# Patient Record
Sex: Male | Born: 1935 | Race: Black or African American | Hispanic: No | Marital: Single | Smoking: Never smoker
Health system: Southern US, Community
[De-identification: ages and names within clinical notes are randomized; demographics above are authoritative.]

## PROBLEM LIST (undated history)

## (undated) DIAGNOSIS — Z950 Presence of cardiac pacemaker: Secondary | ICD-10-CM

## (undated) DIAGNOSIS — N183 Chronic kidney disease, stage 3 unspecified: Secondary | ICD-10-CM

## (undated) DIAGNOSIS — G309 Alzheimer's disease, unspecified: Secondary | ICD-10-CM

## (undated) DIAGNOSIS — R519 Headache, unspecified: Secondary | ICD-10-CM

## (undated) DIAGNOSIS — D696 Thrombocytopenia, unspecified: Secondary | ICD-10-CM

## (undated) DIAGNOSIS — R51 Headache: Secondary | ICD-10-CM

## (undated) DIAGNOSIS — I10 Essential (primary) hypertension: Secondary | ICD-10-CM

## (undated) DIAGNOSIS — F0280 Dementia in other diseases classified elsewhere without behavioral disturbance: Secondary | ICD-10-CM

## (undated) DIAGNOSIS — I495 Sick sinus syndrome: Secondary | ICD-10-CM

## (undated) HISTORY — DX: Sick sinus syndrome: I49.5

---

## 2005-07-23 ENCOUNTER — Encounter: Admission: RE | Admit: 2005-07-23 | Discharge: 2005-07-23 | Payer: Self-pay | Admitting: Family Medicine

## 2005-11-09 HISTORY — PX: PACEMAKER INSERTION: SHX728

## 2005-11-09 HISTORY — PX: CARDIAC CATHETERIZATION: SHX172

## 2006-03-24 ENCOUNTER — Inpatient Hospital Stay (HOSPITAL_COMMUNITY): Admission: EM | Admit: 2006-03-24 | Discharge: 2006-03-26 | Payer: Self-pay | Admitting: Emergency Medicine

## 2009-09-29 ENCOUNTER — Encounter: Payer: Self-pay | Admitting: Emergency Medicine

## 2009-09-30 ENCOUNTER — Encounter (INDEPENDENT_AMBULATORY_CARE_PROVIDER_SITE_OTHER): Payer: Self-pay | Admitting: Internal Medicine

## 2009-09-30 ENCOUNTER — Inpatient Hospital Stay (HOSPITAL_COMMUNITY): Admission: EM | Admit: 2009-09-30 | Discharge: 2009-10-01 | Payer: Self-pay | Admitting: Internal Medicine

## 2010-07-12 ENCOUNTER — Emergency Department (HOSPITAL_COMMUNITY): Admission: EM | Admit: 2010-07-12 | Discharge: 2010-07-12 | Payer: Self-pay | Admitting: Emergency Medicine

## 2011-01-22 LAB — CBC
HCT: 38.5 % — ABNORMAL LOW (ref 39.0–52.0)
Hemoglobin: 13.4 g/dL (ref 13.0–17.0)
MCH: 29.7 pg (ref 26.0–34.0)
MCHC: 34.8 g/dL (ref 30.0–36.0)
RDW: 12.9 % (ref 11.5–15.5)
WBC: 8.7 10*3/uL (ref 4.0–10.5)

## 2011-01-22 LAB — BRAIN NATRIURETIC PEPTIDE: Pro B Natriuretic peptide (BNP): <30 pg/mL (ref 0.0–100.0)

## 2011-01-22 LAB — DIFFERENTIAL
Basophils Absolute: 0 10*3/uL (ref 0.0–0.1)
Lymphocytes Relative: 12 % (ref 12–46)
Lymphs Abs: 1.1 10*3/uL (ref 0.7–4.0)
Monocytes Relative: 6 % (ref 3–12)

## 2011-01-22 LAB — BASIC METABOLIC PANEL
CO2: 24 mEq/L (ref 19–32)
Creatinine, Ser: 1.61 mg/dL — ABNORMAL HIGH (ref 0.4–1.5)
GFR calc non Af Amer: 42 mL/min — ABNORMAL LOW (ref >60)

## 2011-01-22 LAB — POCT CARDIAC MARKERS: Troponin i, poc: <0.05 ng/mL (ref 0.00–0.09)

## 2011-02-11 LAB — CARDIAC PANEL(CRET KIN+CKTOT+MB+TROPI)
CK, MB: 1.3 ng/mL (ref 0.3–4.0)
CK, MB: 1.5 ng/mL (ref 0.3–4.0)
Relative Index: 1 (ref 0.0–2.5)
Total CK: 124 U/L (ref 7–232)
Total CK: 131 U/L (ref 7–232)
Troponin I: 0.01 ng/mL (ref 0.00–0.06)
Troponin I: 0.01 ng/mL (ref 0.00–0.06)

## 2011-02-11 LAB — CROSSMATCH
ABO/RH(D): B POS
Antibody Screen: NEGATIVE

## 2011-02-11 LAB — COMPREHENSIVE METABOLIC PANEL
AST: 22 U/L (ref 0–37)
Albumin: 3 g/dL — ABNORMAL LOW (ref 3.5–5.2)
BUN: 48 mg/dL — ABNORMAL HIGH (ref 6–23)
CO2: 27 mEq/L (ref 19–32)
Calcium: 9 mg/dL (ref 8.4–10.5)
Chloride: 107 mEq/L (ref 96–112)
Creatinine, Ser: 1.39 mg/dL (ref 0.4–1.5)
GFR calc Af Amer: >60 mL/min (ref >60)
Glucose, Bld: 191 mg/dL — ABNORMAL HIGH (ref 70–99)
Potassium: 4.5 mEq/L (ref 3.5–5.1)

## 2011-02-11 LAB — PROTIME-INR
INR: 1.2 (ref 0.00–1.49)
Prothrombin Time: 15.1 seconds (ref 11.6–15.2)

## 2011-02-11 LAB — CBC
HCT: 27.6 % — ABNORMAL LOW (ref 39.0–52.0)
HCT: 32.3 % — ABNORMAL LOW (ref 39.0–52.0)
Hemoglobin: 9.4 g/dL — ABNORMAL LOW (ref 13.0–17.0)
MCHC: 34.1 g/dL (ref 30.0–36.0)
MCHC: 33.2 g/dL (ref 30.0–36.0)
MCV: 90.9 fL (ref 78.0–100.0)
MCV: 91.3 fL (ref 78.0–100.0)
MCV: 91.2 fL (ref 78.0–100.0)
Platelets: 119 10*3/uL — ABNORMAL LOW (ref 150–400)
Platelets: 128 10*3/uL — ABNORMAL LOW (ref 150–400)
RBC: 2.72 MIL/uL — ABNORMAL LOW (ref 4.22–5.81)
RBC: 3.02 MIL/uL — ABNORMAL LOW (ref 4.22–5.81)
RDW: 13.1 % (ref 11.5–15.5)
RDW: 13.2 % (ref 11.5–15.5)
RDW: 13.4 % (ref 11.5–15.5)
WBC: 8.3 10*3/uL (ref 4.0–10.5)
WBC: 8.1 10*3/uL (ref 4.0–10.5)

## 2011-02-11 LAB — DIFFERENTIAL
Basophils Absolute: 0 10*3/uL (ref 0.0–0.1)
Basophils Relative: 1 % (ref 0–1)
Eosinophils Absolute: 0.2 10*3/uL (ref 0.0–0.7)
Neutro Abs: 5.5 10*3/uL (ref 1.7–7.7)
Neutrophils Relative %: 67 % (ref 43–77)

## 2011-02-11 LAB — BASIC METABOLIC PANEL
BUN: 20 mg/dL (ref 6–23)
Chloride: 109 mEq/L (ref 96–112)
Chloride: 115 mEq/L — ABNORMAL HIGH (ref 96–112)
Creatinine, Ser: 1.09 mg/dL (ref 0.4–1.5)
Creatinine, Ser: 1.18 mg/dL (ref 0.4–1.5)
Glucose, Bld: 104 mg/dL — ABNORMAL HIGH (ref 70–99)
Potassium: 3.8 mEq/L (ref 3.5–5.1)

## 2011-02-11 LAB — HEMOGLOBIN AND HEMATOCRIT, BLOOD
HCT: 26.5 % — ABNORMAL LOW (ref 39.0–52.0)
HCT: 27.4 % — ABNORMAL LOW (ref 39.0–52.0)
Hemoglobin: 9.4 g/dL — ABNORMAL LOW (ref 13.0–17.0)

## 2011-02-11 LAB — APTT: aPTT: 22 seconds — ABNORMAL LOW (ref 24–37)

## 2011-02-11 LAB — CK TOTAL AND CKMB (NOT AT ARMC): Total CK: 188 U/L (ref 7–232)

## 2011-02-11 LAB — TROPONIN I: Troponin I: 0.02 ng/mL (ref 0.00–0.06)

## 2011-02-11 LAB — ABO/RH: ABO/RH(D): B POS

## 2011-03-27 NOTE — Cardiovascular Report (Signed)
Wesley Cook, Wesley Cook NO.:  [id number]   MEDICAL RECORD NO.:  [id number]          PATIENT TYPE:  INP   LOCATION:  2036                         FACILITY:  [facility]   PHYSICIAN:  Corky Crafts, MDDATE OF BIRTH:  12-05-1933   DATE OF PROCEDURE:  03/25/2006  DATE OF DISCHARGE:                              CARDIAC CATHETERIZATION   PRIMARY CARE PHYSICIAN:  Joselyn Arrow, M.D.   PROCEDURES:  Left heart catheterization, coronary angiogram, left  ventriculogram, abdominal aortogram.   SURGEON:  Dr. Eldridge Dace.   INDICATIONS FOR PROCEDURE:  Syncope, bradycardia, and shortness of breath.   DESCRIPTION OF PROCEDURE:  After the risks and benefits of cardiac  catheterization were explained to the patient and informed consent was  obtained, the patient was brought to the catheterization lab and placed on  the table. He was prepped and draped in the usual sterile fashion. Lidocaine  1% was infiltrated into his right groin. A 6 French arterial sheath was  placed into his right femoral artery using the modified Seldinger technique.  Left coronary artery angiography was performed using a JL 4.0 catheter. The  catheter was advanced to the ostium of the left main under fluoroscopic  guidance. Digital angiography was performed in multiple projections using  hand injection of contrast. Right coronary artery angiography was then  performed with a JR 4.0 catheter. Digital angiography was performed in  multipleprojections using hand injection of contrast. A pigtail catheter was  then advanced to the ascending aorta and across the aortic valve under  fluoroscopic guidance. A power injection of contrast was performed in the 30  degree RAO position to obtain a ventriculogram. Pressure monitoring was  performed prior to the ventriculogram. The catheter was then pulled back  across the aortic valve under continuous hemodynamic pressure monitoring.  The catheter was pulled back to the  abdominal aorta. A power injection of  contrast was performed to visualize the abdominal aorta. The sheath will be  pulled out using manual compression.   FINDINGS:  1.  The left main is widely patent.  2.  The left circumflex is a medium sized vessel with luminal      irregularities.  3.  The ramus is a large vessel, which supplies the lateral wall of the      heart. There are luminal irregularities in this vessel.  4.  The left anterior descending is a large vessel with luminal      irregularities throughout including an ostial 20% lesion.  5.  The first diagonal and second diagonal are both small vessels.  6.  The right coronary artery is a medium sized dominant vessel with luminal      irregularities.  7.  The left ventriculogram shows normal left ventricular function with an      ejection fraction of 60%. There is no mitral regurgitation.  8.  The abdominal aortogram shows no aneurysm. There is a single left renal      artery, which is free of stenosis. There is possible dual supply to the      right kidney. There is no stenosis noted  in either vessel.   HEMODYNAMICS:  Left ventricular pressure 140/14; LVEDP of 26 mmHg; aortic  pressure 140/63; mean aortic pressure of 92 mmHg.   IMPRESSION:  1.  No hemodynamically significant coronary artery disease.  2.  Normal left ventricular function.  3.  No renal artery stenosis.   RECOMMENDATIONS:  1.  The patient will require aggressive preventive therapy.  2.  Will also consider placing a permanent pacemaker. It was noted pre-      procedure that the patient had significant bradycardia with heart rate      noted to be at 38 beats per minute.  3.  Continue aspirin.  4.  Continue blood pressure control and lipid management.  5.  The patient will follow up with both Dr. Lynelle Doctor and myself.      Corky Crafts, MD  Electronically Signed     JSV/MEDQ  D:  03/25/2006  T:  03/26/2006  Job:  [id number]   cc:   Lavonda Jumbo, M.D.   Fax: [phone]

## 2011-03-27 NOTE — H&P (Signed)
NAMEAMEYA, KUTZ NO.:  [id number]   MEDICAL RECORD NO.:  [id number]          PATIENT TYPE:  EMS   LOCATION:  MAJO                         FACILITY:  [facility]   PHYSICIAN:  Hollice Espy, M.D.DATE OF BIRTH:  1934-08-03   DATE OF ADMISSION:  03/23/2006  DATE OF DISCHARGE:                                HISTORY & PHYSICAL   ATTENDING PHYSICIAN:  Sherin Quarry, M.D.   PRIMARY CARE PHYSICIAN:  Lavonda Jumbo, M.D.   [facility]   CHIEF COMPLAINT:  Syncope.   HISTORY OF PRESENT ILLNESS:  The patient is a 77 year old African-American  male with past medical history of glaucoma and MI in 1995 as well as in 1998  who recently moved down to [place].  He said he has been in good health  with no complaints.  He is followed by his PCP here in town.  He previously  has been doing well, but then today felt very weak and all of a sudden he  passed out.  He did not lose control of his bowel or bladder and was only  unconscious for less than a minute.  His wife was with him.  He was brought  into the emergency room for further evaluation.  In the emergency room, the  patient had labs checked.  He was noted to have a white count of 10.3 with  an 80% shift.  The other labs of note were a creatinine of 1.6 with a normal  BUN, but most concerning was a heart rate which has stayed low ranging  anywhere from 56 down to as low as 46 and no higher.  The patient's blood  pressure was initially low at 90/47, since then has slightly improved to  116/71.  Currently, the patient is doing well and denies any headaches,  vision changes, dysphagia, chest pain, palpitations, shortness of breath,  wheeze, cough, abdominal pain, hematuria, dysuria, constipation, diarrhea,  focal extremity numbness, weakness, or pain.   REVIEW OF SYSTEMS:  Otherwise negative.   PAST MEDICAL HISTORY:  MI x2 and glaucoma.   MEDICATIONS:  1.  Trusopt ophthalmic solution one drop  both eyes three times a day.  2.  Xalatan 0.005% ophthalmic solution one drop both eyes once a day.  3.  Aspirin 81 mg p.o. daily.  4.  Multivitamin p.o. daily.  5.  Darvon one p.o. daily.   SOCIAL HISTORY:  The patient denies any tobacco, alcohol, or drug use.   FAMILY HISTORY:  Noncontributory.   PHYSICAL EXAMINATION:  VITAL SIGNS:  Temperature 97.2, heart rate 52 down to  as low as 46, blood pressure initially 90/47 and since then has improved to  116/71, respirations 18, O2 saturation 100% on room air.  GENERAL:  The patient is alert and oriented x3 in no acute distress.  HEENT:  Normocephalic and atraumatic.  His mucous membranes are moist.  He  has no carotid bruits.  HEART:  Regular rhythm, mild bradycardia.  LUNGS:  Clear to auscultation bilaterally.  ABDOMEN:  Soft, nontender, nondistended, positive bowel sounds.  EXTREMITIES:  No cyanosis, clubbing,  or edema.   LABORATORY DATA:  Sodium 131, potassium 3.7, chloride 111, bicarb 26, BUN  14, creatinine 1.6, glucose 110.  LFT's are notable for an albumin of 3 and  total protein of 4.9, magnesium level 2, sodium 141, potassium 3.7, chloride  108, bicarb 23, BUN 15, creatinine 1.6, glucose 102.   ASSESSMENT:  1.  Syncope likely secondary to bradycardia.  No clear evidence of any      medication-related causes.  Concerned with the patient's coronary artery      disease history.  I would favor checking cardiac enzymes now and repeat      again in the morning.  We will also get [facility] involved for      evaluation of possible sick sinus syndrome.  2.  Renal insufficiency, likely brought on by hypertension and coronary      artery disease.  We will continue to follow and gently hydrate, repeat      labs in the morning.  3.  Noted shift on white count.  We will recheck in the a.m.  4.  Glaucoma.  Continue medications.      Hollice Espy, M.D.  Electronically Signed     SKK/MEDQ  D:  03/23/2006  T:   03/24/2006  Job:  [id number]

## 2011-03-27 NOTE — Consult Note (Signed)
NAMEKIRILL, CHATTERJEE NO.:  [id number]   MEDICAL RECORD NO.:  [id number]          PATIENT TYPE:  INP   LOCATION:  2036                         FACILITY:  [facility]   PHYSICIAN:  Corky Crafts, MDDATE OF BIRTH:  [birth year]   DATE OF CONSULTATION:  03/24/2006  DATE OF DISCHARGE:                                   CONSULTATION   REASON FOR CONSULTATION:  Syncope.   PRIMARY CARE PHYSICIAN:  Dr. Joselyn Arrow.   HISTORY OF PRESENT ILLNESS:  The patient is a 77 year old man with a history  of MI in 1995. He underwent cardiac catheterization at that time but  apparently did not have any intervention. That happened in [place]. In  February 2006, he was sent to [facility] for a stress Cardiolite that  revealed no ischemia or prior infarct. The patient was admitted yesterday  for syncope. He had gone fishing in the morning. Went home and ate lunch. He  then took a nap. When he woke up, he felt nauseated, dizzy, and diaphoretic.  He did not have chest pain. His wife helped him to bed and he subsequently  passed out. The patient does not remember anything until EMS arrived. He has  been noted to be bradycardiac with heart rates ranging from the high 30s to  the 40s. He has no prior history of syncope, pre-syncope, or dizziness. He  does state that over the past few weeks, he has had a few episodes of  dyspnea on exertion. When walking, he would have to stop to catch his breath  and would re-start walking.   ALLERGIES:  NO KNOWN DRUG ALLERGIES.   CURRENT MEDICATIONS:  1.  Trusopt eye drops.  2.  Xalatan eye drops.  3.  Baby aspirin daily.  4.  Multivitamin.  5.  Darvon p.r.n.   SOCIAL HISTORY:  The patient is married. He does not smoke. He does not  drink. He does not use any illegal drugs.   FAMILY HISTORY:  Mother died at age 40 from a stroke. His father died at age  51 from lung cancer.   PAST MEDICAL HISTORY:  1.  MI in 1995 as noted above.  2.   Glaucoma.  3.  Hypertension.   REVIEW OF SYSTEMS:  No chest pain. Shortness of breath as noted above. No  fever or chills. No weight loss. Nausea as noted above. No focal weakness.  No rash. All other systems negative.   PHYSICAL EXAMINATION:  VITAL SIGNS:  Blood pressure 112/66, pulse 47. The  patient is afebrile.  GENERAL:  Awake and alert. No apparent distress.  HEENT:  Head:  Normocephalic and atraumatic. Eyes:  Extraocular movements  intact.  NECK:  No carotid bruits. No JVD.  CARDIOVASCULAR:  Bradycardic. S1 and S2.  LUNGS:  Clear to auscultation bilaterally.  ABDOMEN:  Soft, nontender, and nondistended.  EXTREMITIES:  No edema. Palpable pedal pulses.  SKIN:  No rash.  BACK:  No kyphosis or scoliosis.  NEUROLOGIC:  No focal deficits.  PSYCHIATRIC:  Normal mood and affect.   LABORATORY DATA:  Hematocrit of 40.7,  platelets 138,000, creatinine 1.4.  Cardiac enzymes are negative. However, the total CKs are elevated at 394,  439.   EKG showed sinus bradycardia with a rate of 42. Nonspecific ST-T wave  changes.   MEDICAL DECISION MAKING:  A 77 year old with syncope, bradycardia, and  hypertension. No prior history of myocardial infarction.   PLAN:  1.  CARDIAC:  Given the patient's dyspnea on exertion and syncope along with      his prior history of MI, will perform a cardiac cath to evaluate for      coronary artery disease.  2.  Will also check TSH. If the patient does turn out to be hypothyroid,      that would be a treatable cause of his bradycardia. Otherwise, since he      does have sinus node dysfunction and syncope, we would consider putting      in a pacemaker.  3.  The patient will be followed on telemetry.      Corky Crafts, MD  Electronically Signed     JSV/MEDQ  D:  03/25/2006  T:  03/26/2006  Job:  [id number]

## 2011-03-27 NOTE — Discharge Summary (Signed)
[name], [name] NO.:  [id number]   MEDICAL RECORD NO.:  [id number]          PATIENT TYPE:  INP   LOCATION:  2036                         FACILITY:  [facility]   PHYSICIAN:  Sherin Quarry, MD      DATE OF BIRTH:  1933/12/17   DATE OF ADMISSION:  03/23/2006  DATE OF DISCHARGE:  03/26/2006                                 DISCHARGE SUMMARY   Wesley Cook is a 77 year old man with a past history of glaucoma as well  as a previous myocardial infarction in 1995.  On May 15 the patient suddenly  began to feel weak and experienced a syncopal episode without apparent  seizure activity or loss of bowel or bladder control.  On arrival to the  emergency room the patient was noted to have a heart rate which fluctuated  between 46 and 56.  Blood pressure was initially 90/47 but to improved with  IV hydration to 116/71.  By the time the patient was seen by Dr. Rito Ehrlich,  he was relatively asymptomatic.   PHYSICAL EXAMINATION:  VITAL SIGNS:  His temperature is 97.2, heart rate was  46, respirations were 18 and O2 saturation 100%.  HEENT exam was within normal limits.  CHEST:  Clear.  CARDIOVASCULAR:  Normal S1 andS2 without rubs, murmurs or gallops.  The  patient was noted to be bradycardiac.  ABDOMEN:  Benign.  EXTREMITIES:  No signs of cyanosis or edema.   Relevant laboratory studies obtained included serial cardiac enzymes which  were negative.  The sodium was 139, potassium 3.5, creatinine was 1.4, BUN  was 12, glucose was 100.  Thyroid studies were obtained which showed a TR of  1, TSH of 1.7. which were within normal range.   The patient was seen to consultation by Dr. Eldridge Dace of [facility],  who recommended proceeding with cardiac catheterization and then possibly  subsequent pacemaker.  Cardiac catheterization was carried out on May 17.  The patient was noted to have normal coronary arteries.  Left ventricular  ejection fraction was estimated to be 60%.  It  was Dr. Hoyle Barr  recommendation that the patient had been normal left ventricular function  and no significant coronary artery disease.  Therefore, the pacemaker was  placed.  A Medtronic dual-chamber pacemaker was implanted on May 17.  This  was performed by Dr. Amil Amen.  The patient tolerated the procedure well.  On  May 18 he was feeling well.  His telemetry showed that the pacemaker was  functioning and heart rate was consistently 60, blood pressure was 138/68.  Therefore, it was felt reasonable to discharge the patient at that time.   DISCHARGE DIAGNOSES:  1.  Symptomatic bradycardia.  2.  Syncope, probably secondary to bradycardia.  3.  Glaucoma.   DISCHARGE MEDICATIONS:  The patient will continue his glaucoma medicines,  which are Trusopt and Xalatan drops.  He will also continue aspirin 81 mg  daily.  He is of the practice of taking one Darvon daily.   He will follow up with the office with Dr. Amil Amen on May 31 and was  instructed  to call Dr. Amil Amen if any problems should arise with the  pacemaker site.  He will also follow up with Dr. Joselyn Arrow.   CONDITION AT TIME OF DISCHARGE:  Good.           ______________________________  Sherin Quarry, MD     SY/MEDQ  D:  03/26/2006  T:  03/26/2006  Job:  [id number]   cc:   Lavonda Jumbo, M.D.  Fax: [phone]   Corky Crafts, MD  Fax: [phone]

## 2011-03-27 NOTE — Op Note (Signed)
NAMEGERHART, RUGGIERI               ACCOUNT NO.:  [id number]   MEDICAL RECORD NO.:  [id number]          PATIENT TYPE:  INP   LOCATION:  2036                         FACILITY:  [facility]   PHYSICIAN:  Francisca December, M.D.  DATE OF BIRTH:  1934-01-13   DATE OF PROCEDURE:  03/25/2006  DATE OF DISCHARGE:                                 OPERATIVE REPORT   PROCEDURE PERFORMED:  1.  Insertion dual-chamber permanent transvenous pacemaker.  2.  Left subclavian venogram.   INDICATION:  Wesley Cook is a 77 year old man who was admitted with  syncope and has been observed have marked bradycardia during this telemetry  monitoring with heart rates as low as 38-45 beats per minute.  Dr. Catalina Gravel has completed coronary angiography ruling out possibility of CAD as  an etiology.  He is brought to the catheterization laboratory at this time  for insertion of dual-chamber permanent transvenous pacemaker with a  diagnosis of syncope and sinus node dysfunction.   PROCEDURE NOTE:  The patient brought to cardiac catheterization laboratory  in fasting state.  The left prepectoral region was prepped and draped in the  usual sterile fashion.  Local anesthesia was obtained with infiltration of  1% lidocaine with epinephrine throughout the left prepectoral region.  A  left subclavian venogram was then performed with a peripheral injection of  20 mL of Omnipaque.  A digital cine angiogram was obtained in the AP  projection and road mapped to guide future left subclavian puncture.  The  venogram did demonstrate the vein to be widely patent and coursing in a  normal fashion over the anterior surface of the first rib and beneath the  middle third of the clavicle.  There was no evidence for persistence of the  left superior vena cava.  A 6 to 7 cm incision was then made in the  deltopectoral groove and this was carried down by sharp dissection and  electrocautery to the prepectoral fascia.  There a plane was  lifted and the  pocket formed inferiorly and medially using electrocautery and blunt  dissection.  The pocket was then packed with 1% kanamycin soaked gauze.  Two  separate left subclavian punctures were then performed with an 18-gauge thin-  wall needle through which was passed a 0.038 inch tight J guidewire.  Over  the initial guidewire a 7-French tearaway sheath and dilator were advanced.  The dilator and wire were removed and the ventricular lead was advanced to  the level of the right atrium.  The sheath was torn away.  Using standard  technique and fluoroscopic landmarks.  The lead was manipulated in the right  ventricular apex.  There excellent pacing parameters were obtained as will  be noted below.  The lead was tested for diaphragmatic pacing at 10 volts  and none was found.  The lead was then sutured into place using three  separate 0 silk ligature.  Over the remaining guidewire a 9-French tearaway  sheath and dilator were advanced.  The dilator was removed and the wire was  allowed to remain in place and the  atrial lead was advanced to the level of  the right atrium and sheath was torn away.  A figure-of-eight hemostasis  suture was placed around the entry site of the leads into the pectoralis  muscle to control backbleeding.  Using standard technique and fluoroscopic  landmarks, the lead was manipulated into the right atrial appendage.  There  excellent pacing parameters were obtained as will be noted below.  This was  an active fixation lead and the screw was advanced as appropriate  fluoroscopy.  The lead was tested for diaphragmatic pacing at 10 volts and  none was found.  The lead was then sutured into place using three separate 0  silk ligatures.  The remaining guidewire and the kanamycin soaked gauze were  removed from the pocket.  The pocket was copiously irrigated using 1%  kanamycin solution.  The leads were then attached the pacing generator  carefully identifying  each lead by its serial number and placing each into  the appropriate receptacle.  Each lead was tightened into place and tested  for security.  The leads were then wound beneath the pacing generator and  the generator was placed in the pocket.  The pocket was inspected for  bleeding and none was found.  The pocket was then closed using 2-0 Vicryl in  a running fashion, two layers for the subcutaneous tissue.  The skin was  approximated using 4-0 Vicryl running subcuticular fashion.  Steri-Strips  and sterile dressing were applied and the patient is transported to recovery  area in stable condition in an A pace V pace mode.   EQUIPMENT DATA:  Pacing generator is Medtronic Adapta model number [id number],  serial number [id number]. Atrial lead is Medtronic model number [id number],  serial number [id number].  The ventricular lead is Medtronic model number  [id number], serial number [id number].   PACING DATA:  The ventricular lead detected a 16.5 mV R wave.  The pacing  threshold was 0.4 volts at 0.5 milliseconds pulse width.  The impedance was  746 ohms resulting in a current at capture threshold of 0.6 MA.  The atrial  lead detected a 5.9 mV P-wave.  The pacing threshold was 1.2 volts at 0.5  milliseconds pulse width.  The impedance was 499 ohms resulting in a current  at capture threshold of 2.7 MA.      Francisca December, M.D.  Electronically Signed     JHE/MEDQ  D:  03/25/2006  T:  03/26/2006  Job:  [id number]

## 2011-05-01 ENCOUNTER — Emergency Department (HOSPITAL_COMMUNITY)
Admission: EM | Admit: 2011-05-01 | Discharge: 2011-05-01 | Disposition: A | Discharge status: Home / Self Care | Payer: Medicare Other | Attending: Emergency Medicine | Admitting: Emergency Medicine

## 2011-05-01 ENCOUNTER — Emergency Department (HOSPITAL_COMMUNITY): Payer: Medicare Other

## 2011-05-01 DIAGNOSIS — M25559 Pain in unspecified hip: Secondary | ICD-10-CM | POA: Insufficient documentation

## 2011-05-01 DIAGNOSIS — E789 Disorder of lipoprotein metabolism, unspecified: Secondary | ICD-10-CM | POA: Insufficient documentation

## 2011-05-01 DIAGNOSIS — M79609 Pain in unspecified limb: Secondary | ICD-10-CM | POA: Insufficient documentation

## 2011-05-01 DIAGNOSIS — I10 Essential (primary) hypertension: Secondary | ICD-10-CM | POA: Insufficient documentation

## 2012-02-12 ENCOUNTER — Emergency Department (HOSPITAL_COMMUNITY): Payer: Medicare Other

## 2012-02-12 ENCOUNTER — Other Ambulatory Visit: Payer: Self-pay

## 2012-02-12 ENCOUNTER — Encounter (HOSPITAL_COMMUNITY): Payer: Self-pay | Admitting: Emergency Medicine

## 2012-02-12 ENCOUNTER — Emergency Department (HOSPITAL_COMMUNITY)
Admission: EM | Admit: 2012-02-12 | Discharge: 2012-02-12 | Disposition: A | Discharge status: Home / Self Care | Payer: Medicare Other | Attending: Internal Medicine | Admitting: Internal Medicine

## 2012-02-12 DIAGNOSIS — R55 Syncope and collapse: Secondary | ICD-10-CM

## 2012-02-12 DIAGNOSIS — I959 Hypotension, unspecified: Secondary | ICD-10-CM | POA: Insufficient documentation

## 2012-02-12 DIAGNOSIS — I10 Essential (primary) hypertension: Secondary | ICD-10-CM | POA: Insufficient documentation

## 2012-02-12 HISTORY — DX: Essential (primary) hypertension: I10

## 2012-02-12 LAB — COMPREHENSIVE METABOLIC PANEL
Albumin: 3.4 g/dL — ABNORMAL LOW (ref 3.5–5.2)
Alkaline Phosphatase: 60 U/L (ref 39–117)
BUN: 16 mg/dL (ref 6–23)
Calcium: 9.2 mg/dL (ref 8.4–10.5)
Creatinine, Ser: 1.52 mg/dL — ABNORMAL HIGH (ref 0.50–1.35)
GFR calc Af Amer: 50 mL/min — ABNORMAL LOW (ref >90)
Glucose, Bld: 130 mg/dL — ABNORMAL HIGH (ref 70–99)
Potassium: 4.1 mEq/L (ref 3.5–5.1)
Total Protein: 6.1 g/dL (ref 6.0–8.3)

## 2012-02-12 LAB — DIFFERENTIAL
Basophils Absolute: 0 K/uL (ref 0.0–0.1)
Basophils Relative: 0 % (ref 0–1)
Eosinophils Absolute: 0.2 10*3/uL (ref 0.0–0.7)
Eosinophils Relative: 3 % (ref 0–5)
Lymphocytes Relative: 13 % (ref 12–46)
Lymphs Abs: 0.9 10*3/uL (ref 0.7–4.0)
Monocytes Absolute: 0.3 10*3/uL (ref 0.1–1.0)
Monocytes Relative: 4 % (ref 3–12)
Neutro Abs: 5.2 K/uL (ref 1.7–7.7)
Neutrophils Relative %: 79 % — ABNORMAL HIGH (ref 43–77)

## 2012-02-12 LAB — CBC
HCT: 38.4 % — ABNORMAL LOW (ref 39.0–52.0)
Hemoglobin: 13.2 g/dL (ref 13.0–17.0)
MCH: 29.7 pg (ref 26.0–34.0)
MCHC: 34.4 g/dL (ref 30.0–36.0)
MCV: 86.5 fL (ref 78.0–100.0)
Platelets: 124 K/uL — ABNORMAL LOW (ref 150–400)
RBC: 4.44 MIL/uL (ref 4.22–5.81)
RDW: 12.6 % (ref 11.5–15.5)
WBC: 6.7 K/uL (ref 4.0–10.5)

## 2012-02-12 LAB — APTT: aPTT: 27 seconds (ref 24–37)

## 2012-02-12 LAB — COMPREHENSIVE METABOLIC PANEL WITH GFR
ALT: 20 U/L (ref 0–53)
AST: 19 U/L (ref 0–37)
CO2: 29 meq/L (ref 19–32)
Chloride: 105 meq/L (ref 96–112)
GFR calc non Af Amer: 43 mL/min — ABNORMAL LOW (ref >90)
Sodium: 140 meq/L (ref 135–145)
Total Bilirubin: 0.6 mg/dL (ref 0.3–1.2)

## 2012-02-12 LAB — PROTIME-INR
INR: 1.18 (ref 0.00–1.49)
Prothrombin Time: 15.3 s — ABNORMAL HIGH (ref 11.6–15.2)

## 2012-02-12 LAB — POCT I-STAT TROPONIN I: Troponin i, poc: 0.01 ng/mL (ref 0.00–0.08)

## 2012-02-12 LAB — D-DIMER, QUANTITATIVE: D-Dimer, Quant: 0.57 ug/mL-FEU — ABNORMAL HIGH (ref 0.00–0.48)

## 2012-02-12 MED ORDER — IOHEXOL 350 MG/ML SOLN
100.0000 mL | Freq: Once | INTRAVENOUS | Status: AC | PRN
  Administered 2012-02-12: 100 mL via INTRAVENOUS

## 2012-02-12 NOTE — Consult Note (Signed)
Admit date: 02/12/2012 Referring Physician  Dr. Oletta Lamas  Primary Cardiologist  Eldridge Dace Reason for Consultation  syncope  HPI: 78 year old man who had syncope several years ago.  At that time, he had bradycardia and a pacemaker was placed.  He has done well since that time.  Earlier today, he recalls eating lunch and then has no recollection of what happened other than showing up in the hospital.  Apparently, there is a friend who is with him who states that he started throwing up and then had "convulsions".  The patient states that his pants were somewhat wet.  He is unsure whether he lost control of his bladder.  He has never had seizures in the past.  Prior to this event, he has not noticed any chest discomfort or palpitations.  He walks regularly and has no problems with his activity.  He had a heart catheterization several years ago and did not require angioplasty.     PMH:   Past Medical History  Diagnosis Date  . Hypertension      PSH:   Past Surgical History  Procedure Date  . Pacemaker insertion     Allergies:  Review of patient's allergies indicates no known allergies. Prior to Admit Meds:   (Not in a hospital admission) Fam HX:   History reviewed. No pertinent family history. Social HX:    History   Social History  . Marital Status: Single    Spouse Name: N/A    Number of Children: N/A  . Years of Education: N/A   Occupational History  . Not on file.   Social History Main Topics  . Smoking status: Never Smoker   . Smokeless tobacco: Not on file  . Alcohol Use: No  . Drug Use:   . Sexually Active:    Other Topics Concern  . Not on file   Social History Narrative  . No narrative on file     ROS:  All 11 ROS were addressed and are negative except what is stated in the HPI  Physical Exam: Blood pressure 138/76, pulse 60, temperature 98.1 F (36.7 C), resp. rate 16, SpO2 99.00%.   General: Well developed, well nourished, in no acute distress Head:   Normal  cephalic and atramatic  Lungs:   Clear bilaterally to auscultation and percussion. Heart:   HRRR S1 S2 Abdomen: Bowel sounds are positive, abdomen soft and non-tender Msk:  Back normal, Normal strength and tone for age. Extremities:   No  edema.  DP +1 Neuro: Alert and oriented X 3. Psych:  Good affect, responds appropriately    Labs:   Lab Results  Component Value Date   WBC 6.7 02/12/2012   HGB 13.2 02/12/2012   HCT 38.4* 02/12/2012   MCV 86.5 02/12/2012   PLT 124* 02/12/2012    Lab 02/12/12 1126  NA 140  K 4.1  CL 105  CO2 29  BUN 16  CREATININE 1.52*  CALCIUM 9.2  PROT 6.1  BILITOT 0.6  ALKPHOS 60  ALT 20  AST 19  GLUCOSE 130*   No results found for this basename: PTT   Lab Results  Component Value Date   INR 1.18 02/12/2012   INR 1.20 09/29/2009   Lab Results  Component Value Date   CKTOTAL 124 09/30/2009   CKMB 1.3 09/30/2009   TROPONINI  Value: 0.01        NO INDICATION OF MYOCARDIAL INJURY. 09/30/2009     No results found for this basename: CHOL  No results found for this basename: HDL   No results found for this basename: LDLCALC   No results found for this basename: TRIG   No results found for this basename: CHOLHDL   No results found for this basename: LDLDIRECT      Radiology:  No results found.  EKG:  Atrial paced; no ST segment changes  ASSESSMENT: Syncope of unclear etiology.  Symptoms are more concerning for seizure activity.  This was witnessed by the friend who is with him.  He has no recollection of this.  He is unsure whether he lost control of his bladder.    PLAN:  #1 cardiac: Can rule out for MI with enzymes.  I doubt this event is from ischemia.  He had minimal coronary disease by prior catheterization several years ago.  Would watch on telemetry.  Pacemaker interrogation did not reveal any arrhythmia.  Pacer appears to be functioning well with adequate battery life.  Would look for a neurologic cause such as seizures in this  patient.  Corky Crafts., MD  02/12/2012  3:53 PM

## 2012-02-12 NOTE — ED Notes (Signed)
5528-01 Ready 

## 2012-02-12 NOTE — ED Notes (Signed)
Report received, assumed care.  

## 2012-02-12 NOTE — ED Notes (Signed)
Returned from CT scan. Informed patient and/or family of status. No voiced complaints presently. NAD. Denies dizziness or pain presently

## 2012-02-12 NOTE — ED Notes (Signed)
Awaiting CT scan.

## 2012-02-12 NOTE — Discharge Instructions (Signed)
Discharge Against Medical Advice  I am signing this paper to show that I am leaving this hospital or health care center of my own free will. It is done against all medical advice. In doing so, I am releasing this hospital or health care center and the attending physicians from any and all claims that I may want to make.  I understand that further care has been recommended. My condition may worsen. This could cause me further bodily injury, illness, or even death. I do know that the medical staff has fully explained to me the risk that I am taking in leaving against medical advice.  Document Released: 10/26/2005 Document Revised: 10/15/2011 Document Reviewed: 04/12/2007  [employer] Patient Information 2012 [employer].

## 2012-02-12 NOTE — Progress Notes (Signed)
Spoke to the patient extensively, he did not want to be admitted. I went over the probable risks of not being admitted and not being evaluated for the syncope,. Patient and his family at bedside are adamant on going home. He wanted to sign the AMA and leave the hospital.    Kathlen Mody.  [phone]

## 2012-02-12 NOTE — ED Notes (Signed)
Medtronic pacemaker interrogated & information transmitted.

## 2012-02-12 NOTE — ED Notes (Signed)
Pt had pacer placed in 07 here at [facility]

## 2012-02-12 NOTE — ED Notes (Signed)
Friend of pt states that he past out and was shaking and not coming around when this happened

## 2012-02-12 NOTE — ED Notes (Signed)
Patient transported to CT 

## 2012-02-12 NOTE — ED Notes (Signed)
Was eating a sandwich and became dizzy passed out while sitting vomited at  Some point. Upon ems arrival pt pale cool  Diaphoretic unresponsive no bp and poor rad pulses. Pt was lain flat and became more responsive vomited again and since pt has been flat has become aaox3 has IV 20 lac given 500 bolus, bp was 141/71 has atrial pacer

## 2012-02-12 NOTE — ED Notes (Addendum)
Admitting MD at bedside. Denies pain. States, "I feel fine. I want to go home".

## 2012-02-12 NOTE — ED Notes (Signed)
Informed by Admitting MD pt wishes to sign out AMA. Denies any pain or dizziness.

## 2012-02-12 NOTE — ED Notes (Signed)
Returned from CT scan incomplete. Iv infiltrated & discontinued, was unable to place another IV in CT scan after 2 unsuccessful attempts.

## 2012-02-12 NOTE — ED Provider Notes (Signed)
History     CSN: [id number]  Arrival date & time 02/12/12  1041   First MD Initiated Contact with Patient 02/12/12 1051      Chief Complaint  Patient presents with  . Hypotension    (Consider location/radiation/quality/duration/timing/severity/associated sxs/prior treatment) HPI Comments: Patient presents by EMS after a syncopal event at home. Per patient's fiance, the patient was eating a sandwich around 10:15 and the patient reports he remembers this and was almost gone when he started to feel dizzy which he described as a sensation of having to pass out. Recalls thinking that he had to sit down although he is already sitting. That was the last thing that he recalls. His fianc reports that he seemed to slump over and his eyes rolled back in the back of his head. She was calling him and he was not responding. She reports he seemed to convulse for about one or 2 minutes. The patient denies any oral trauma and no urinary incontinence. She reports she called 911 and they were instructing her to try to get him on the ground but she was unable to. She believes about 10 minutes passed before first responders arrived they were able to get him on the ground. Shortly thereafter the patient did rouse. By report, the patient was very hypertensive and diaphoretic and cool. The patient did improve his blood pressure by the time he has arrived here to the emergency department. The patient initially told me that he had no cardiac history, however he does have an atrial pacemaker. In review of old records, the patient apparently may have had a heart attack treated medically in 1995. The patient had a pacemaker placed here in 2007 do to symptomatic bradycardia. Patient reports no recent nausea vomiting or diarrhea, no fevers no cold symptoms. He denies any recent chest pain or exertional chest pain or shortness of breath. He reports he no longer sees a cardiologist but he does get his pacemaker checked by plugging  it in into his home outlet. He reports no problems with that the last time it was checked. He reports his primary care physician is Dr. Fleet Contras and he is currently treated for just hypertension. No recent long distance travel, no lower leg swelling or pain.  Pt reports feeling much improved now.    The history is provided by the patient, a relative and medical records.    Past Medical History  Diagnosis Date  . Hypertension     Past Surgical History  Procedure Date  . Pacemaker insertion     History reviewed. No pertinent family history.  History  Substance Use Topics  . Smoking status: Never Smoker   . Smokeless tobacco: Not on file  . Alcohol Use: No      Review of Systems  Respiratory: Negative for cough and shortness of breath.   Cardiovascular: Negative for chest pain and leg swelling.  Neurological: Positive for dizziness, syncope and light-headedness. Negative for headaches.  All other systems reviewed and are negative.    Allergies  Review of patient's allergies indicates no known allergies.  Home Medications   Current Outpatient Rx  Name Route Sig Dispense Refill  . BRIMONIDINE TARTRATE-TIMOLOL 0.2-0.5 % OP SOLN Left Eye Place 1 drop into the left eye every 12 (twelve) hours.    Marland Kitchen LATANOPROST 0.005 % OP SOLN Both Eyes Place 1 drop into both eyes at bedtime.    Marland Kitchen LISINOPRIL-HYDROCHLOROTHIAZIDE 20-12.5 MG PO TABS Oral Take 1 tablet by mouth daily.  BP 117/68  Pulse 59  Temp 98.1 F (36.7 C)  Resp 18  SpO2 98%  Physical Exam  Nursing note and vitals reviewed. Constitutional: He is oriented to person, place, and time. He appears well-developed and well-nourished. No distress.  HENT:  Head: Normocephalic and atraumatic.  Eyes: Pupils are equal, round, and reactive to light. No scleral icterus.  Neck: Neck supple. No JVD present.  Cardiovascular: Normal rate and regular rhythm.   No murmur heard. Pulmonary/Chest: Effort normal. No respiratory  distress. He has no wheezes.  Abdominal: Soft. He exhibits no distension. There is no rebound and no guarding.  Musculoskeletal: Normal range of motion. He exhibits no edema and no tenderness.  Neurological: He is alert and oriented to person, place, and time.  Skin: Skin is warm. No rash noted.  Psychiatric: He has a normal mood and affect.    ED Course  Procedures (including critical care time)  Labs Reviewed  CBC - Abnormal; Notable for the following:    HCT 38.4 (*)    Platelets 124 (*)    All other components within normal limits  DIFFERENTIAL - Abnormal; Notable for the following:    Neutrophils Relative 79 (*)    All other components within normal limits  COMPREHENSIVE METABOLIC PANEL - Abnormal; Notable for the following:    Glucose, Bld 130 (*)    Creatinine, Ser 1.52 (*)    Albumin 3.4 (*)    GFR calc non Af Amer 43 (*)    GFR calc Af Amer 50 (*)    All other components within normal limits  D-DIMER, QUANTITATIVE - Abnormal; Notable for the following:    D-Dimer, Quant 0.57 (*)    All other components within normal limits  PROTIME-INR - Abnormal; Notable for the following:    Prothrombin Time 15.3 (*)    All other components within normal limits  APTT  POCT I-STAT TROPONIN I   No results found.   1. Syncope     1:44 PM My plan is to consult Dr. Isabel Caprice to see the patient and to evaluate the results from the Medtronic assessment. EKG that was performed at time 10:57, shows atrial pacing at a rate of 66 with narrow complex QRSs. Nonspecific T waves are noted laterally which are slightly worse compared to EKG from 07/12/2010. Otherwise no overt ST elevation or depression.  D-dimer was slightly elevated this will get a CT scan to rule out PE. I did speak with the radiologist regarding his slightly elevated creatinine and was told that his renal function was adequate to handle a small dye load.  Also will admit to [facility] for syncope work up.  MDM  Pt with  syncope and was hypotensive in the field.  No obvious hemorrhage, no fever, not septic appearing.  Possibly vagal episode, but considering he has had bradycardic syncope in the past, will need to interrogate pacer.  Dr. Leontine Locket was cardiology who performed catheterization in 2007.  I reviewed prior records.  Will keep on monitor, monitor carefully, obtain labs, CXR and records shows he had a dual chamber Medtronic pacer:  This is from a prior operative note:  Pacing generator is Medtronic Adapta model number [id number],  serial number [id number]. Atrial lead is Medtronic model number [id number],  serial number [id number]. The ventricular lead is Medtronic model number  [id number], serial number [id number].           Gavin Pound. Gerlene Glassburn, MD 02/12/12 1404

## 2012-02-12 NOTE — ED Notes (Signed)
Returned from CT scan, completed. Tolerated well. No voiced complaints presently. NAD. Denies pain or dizziness. Informed patient and/or family of status. Awaiting admitting MD to arrive.

## 2012-02-12 NOTE — ED Notes (Signed)
Informed patient and/or family of status. No voiced complaints presently. NAD. Medtronic pacemaker report given to Dr. Oletta Lamas

## 2012-02-12 NOTE — ED Notes (Signed)
Called CT scan to inform that pt has another IV placed to use & is ready for scan

## 2012-05-12 ENCOUNTER — Emergency Department (HOSPITAL_COMMUNITY): Payer: Medicare Other

## 2012-05-12 ENCOUNTER — Emergency Department (HOSPITAL_COMMUNITY)
Admission: EM | Admit: 2012-05-12 | Discharge: 2012-05-13 | Disposition: A | Discharge status: Home / Self Care | Payer: Medicare Other | Attending: Emergency Medicine | Admitting: Emergency Medicine

## 2012-05-12 DIAGNOSIS — R569 Unspecified convulsions: Secondary | ICD-10-CM | POA: Insufficient documentation

## 2012-05-12 DIAGNOSIS — I10 Essential (primary) hypertension: Secondary | ICD-10-CM | POA: Insufficient documentation

## 2012-05-12 LAB — POCT I-STAT, CHEM 8
BUN: 21 mg/dL (ref 6–23)
Calcium, Ion: 1.26 mmol/L (ref 1.13–1.30)
Chloride: 105 mEq/L (ref 96–112)
Creatinine, Ser: 1.5 mg/dL — ABNORMAL HIGH (ref 0.50–1.35)
Sodium: 142 mEq/L (ref 135–145)
TCO2: 24 mmol/L (ref 0–100)

## 2012-05-12 MED ORDER — LEVETIRACETAM 500 MG PO TABS
1000.0000 mg | ORAL_TABLET | Freq: Once | ORAL | Status: AC
  Administered 2012-05-13: 1000 mg via ORAL
  Filled 2012-05-12: qty 2

## 2012-05-12 MED ORDER — SODIUM CHLORIDE 0.9 % IV BOLUS (SEPSIS)
1000.0000 mL | Freq: Once | INTRAVENOUS | Status: AC
  Administered 2012-05-12: 1000 mL via INTRAVENOUS

## 2012-05-12 NOTE — ED Notes (Signed)
Kara Mead RN given report on pt.

## 2012-05-12 NOTE — ED Notes (Signed)
Patient transported to CT 

## 2012-05-12 NOTE — ED Notes (Signed)
Per EMS, [place] and then he passed out.  Unknown amount of time. Once he was in EMS he had several episodes of LOC for a few seconds. Does not remember anything. Pt has a pacemaker in atrial paced. Pt does not remember location of cardiologist. Vitals: 123/60s, 57. 18g LAC. Placed on 2L O2 N/C.

## 2012-05-12 NOTE — ED Provider Notes (Signed)
History     CSN: [id number]  Arrival date & time 05/12/12  2234   First MD Initiated Contact with Patient 05/12/12 2300      Chief Complaint  Patient presents with  . Loss of Consciousness    (Consider location/radiation/quality/duration/timing/severity/associated sxs/prior treatment) HPI Hx per PT and fiance. At the fireworks show, standing up against a car and started shaking all over, she caught him, shaking all over lasted about 2 minutes.  She helped him to a chair, he felt dizzy and nauseated, he was amnestic to this event - first recollection since event was being in the ambulance. No ETOH or drug use. No new meds. States he sleeps good and sleeps about 9-10 hours every night. No recent fevers or chills or recent illness.  Eyes rolled back and blinking for about 3 min while sitting in the chair. H/o similar event in April this year. No Sz history. No tongue biting or incontinence, no HA or unilateral weakness. No CP or SOB, no palpitations. Has cardiologist - cant remember his name, has atrial pacemaker. Now is feeling "good" and states he could run around the block.  Event occurred about 90 min PTA. BIB EMS. Fiance states a lot of traffic took a long time to get here. PCP Dr Claudie Revering.  Past Medical History  Diagnosis Date  . Hypertension     Past Surgical History  Procedure Date  . Pacemaker insertion     No family history on file.  History  Substance Use Topics  . Smoking status: Never Smoker   . Smokeless tobacco: Not on file  . Alcohol Use: No      Review of Systems  Constitutional: Negative for fever and chills.  HENT: Negative for neck pain and neck stiffness.   Eyes: Negative for pain.  Respiratory: Negative for shortness of breath.   Cardiovascular: Negative for chest pain.  Gastrointestinal: Negative for abdominal pain.  Genitourinary: Negative for dysuria.  Musculoskeletal: Negative for back pain.  Skin: Negative for rash.  Neurological: Positive for  syncope. Negative for numbness and headaches.  All other systems reviewed and are negative.    Allergies  Review of patient's allergies indicates no known allergies.  Home Medications   Current Outpatient Rx  Name Route Sig Dispense Refill  . BRIMONIDINE TARTRATE-TIMOLOL 0.2-0.5 % OP SOLN Left Eye Place 1 drop into the left eye every 12 (twelve) hours.    Marland Kitchen LATANOPROST 0.005 % OP SOLN Both Eyes Place 1 drop into both eyes at bedtime.    Marland Kitchen LISINOPRIL-HYDROCHLOROTHIAZIDE 20-12.5 MG PO TABS Oral Take 1 tablet by mouth daily.      BP 119/72  Pulse 62  Temp 97.7 F (36.5 C) (Oral)  Resp 19  SpO2 100%  Physical Exam  Constitutional: He is oriented to person, place, and time. He appears well-developed and well-nourished.  HENT:  Head: Normocephalic and atraumatic.  Eyes: Conjunctivae and EOM are normal. Pupils are equal, round, and reactive to light.  Neck: Full passive range of motion without pain. Neck supple. No thyromegaly present.       No meningismus  Cardiovascular: Normal rate, regular rhythm, S1 normal, S2 normal and intact distal pulses.   Pulmonary/Chest: Effort normal and breath sounds normal.  Abdominal: Soft. Bowel sounds are normal. There is no tenderness. There is no CVA tenderness.  Musculoskeletal: Normal range of motion.  Neurological: He is alert and oriented to person, place, and time. He has normal strength and normal reflexes. No cranial nerve deficit  or sensory deficit. He displays a negative Romberg sign. GCS eye subscore is 4. GCS verbal subscore is 5. GCS motor subscore is 6.       Normal Gait  Skin: Skin is warm and dry. No rash noted. No cyanosis. Nails show no clubbing.  Psychiatric: He has a normal mood and affect. His speech is normal and behavior is normal.    ED Course  Procedures (including critical care time)   Labs Reviewed  CBC  COMPREHENSIVE METABOLIC PANEL  URINALYSIS, ROUTINE W REFLEX MICROSCOPIC  CARDIAC PANEL(CRET  KIN+CKTOT+MB+TROPI)     Date: 05/12/2012  Rate: 63  Rhythm: atrial paced  QRS Axis: normal  Intervals: normal  ST/T Wave abnormalities: nonspecific ST changes  Conduction Disutrbances:none  Narrative Interpretation:   Old EKG Reviewed: unchanged    Old records reviewed - atrial pacemaker Medtronic Adapta model number [id number].  The patient may have had a heart attack treated medically in 1995. The patient had a pacemaker placed here in 2007 do to symptomatic bradycardia.  Case d/w NEU on call Dr Pearlean Brownie, who recs Keppra load and 250mg  BID, UDS, no driving until released by NEU and will need EEG/ MRI either inpatient or with close outpatient follow up. PT very much feels well, back to baseline and prefers to go home.   MDM   Syncope that suggests Sz by presentation. No symptoms of cardiac syncope/ tachycardia - PACED on ECG. NEU consult and recs as above. PT declines admission. He remains asymptomatic in emergency department. Maintained on cardiac monitor without arrhythmia and atrial pacing present. Prescription for Keppra as above and plan close outpatient neurology followup.        Sunnie Nielsen, MD 05/13/12 [phone]

## 2012-05-13 ENCOUNTER — Emergency Department (HOSPITAL_COMMUNITY): Payer: Medicare Other

## 2012-05-13 LAB — COMPREHENSIVE METABOLIC PANEL
Alkaline Phosphatase: 78 U/L (ref 39–117)
BUN: 21 mg/dL (ref 6–23)
CO2: 25 mEq/L (ref 19–32)
Chloride: 104 mEq/L (ref 96–112)
Creatinine, Ser: 1.49 mg/dL — ABNORMAL HIGH (ref 0.50–1.35)
GFR calc non Af Amer: 44 mL/min — ABNORMAL LOW (ref >90)
Glucose, Bld: 126 mg/dL — ABNORMAL HIGH (ref 70–99)
Total Bilirubin: 0.2 mg/dL — ABNORMAL LOW (ref 0.3–1.2)

## 2012-05-13 LAB — URINALYSIS, ROUTINE W REFLEX MICROSCOPIC
Ketones, ur: 15 mg/dL — AB
Leukocytes, UA: NEGATIVE
Protein, ur: NEGATIVE mg/dL
Urobilinogen, UA: 0.2 mg/dL (ref 0.0–1.0)

## 2012-05-13 LAB — CBC
Hemoglobin: 12.4 g/dL — ABNORMAL LOW (ref 13.0–17.0)
MCH: 29.2 pg (ref 26.0–34.0)
MCHC: 33.7 g/dL (ref 30.0–36.0)
MCV: 86.8 fL (ref 78.0–100.0)
Platelets: 118 10*3/uL — ABNORMAL LOW (ref 150–400)
RBC: 4.24 MIL/uL (ref 4.22–5.81)

## 2012-05-13 LAB — CARDIAC PANEL(CRET KIN+CKTOT+MB+TROPI): Total CK: 183 U/L (ref 7–232)

## 2012-05-13 LAB — RAPID URINE DRUG SCREEN, HOSP PERFORMED
Amphetamines: NOT DETECTED
Barbiturates: NOT DETECTED
Cocaine: NOT DETECTED
Opiates: NOT DETECTED
Tetrahydrocannabinol: NOT DETECTED

## 2012-05-13 MED ORDER — LEVETIRACETAM 250 MG PO TABS: 250.0000 mg | ORAL_TABLET | Freq: Two times a day (BID) | ORAL | Status: DC

## 2012-05-13 NOTE — ED Notes (Signed)
UA sent to LAB now

## 2012-05-13 NOTE — ED Notes (Signed)
Pt returned from CT °

## 2012-06-19 ENCOUNTER — Emergency Department (HOSPITAL_COMMUNITY)
Admission: EM | Admit: 2012-06-19 | Discharge: 2012-06-19 | Disposition: A | Discharge status: Home / Self Care | Payer: Medicare Other | Attending: Emergency Medicine | Admitting: Emergency Medicine

## 2012-06-19 ENCOUNTER — Encounter (HOSPITAL_COMMUNITY): Payer: Self-pay | Admitting: Physical Medicine and Rehabilitation

## 2012-06-19 DIAGNOSIS — I10 Essential (primary) hypertension: Secondary | ICD-10-CM | POA: Insufficient documentation

## 2012-06-19 DIAGNOSIS — R55 Syncope and collapse: Secondary | ICD-10-CM | POA: Insufficient documentation

## 2012-06-19 DIAGNOSIS — Z7982 Long term (current) use of aspirin: Secondary | ICD-10-CM | POA: Insufficient documentation

## 2012-06-19 LAB — POCT I-STAT, CHEM 8
Calcium, Ion: 1.21 mmol/L (ref 1.13–1.30)
Chloride: 105 mEq/L (ref 96–112)
Glucose, Bld: 128 mg/dL — ABNORMAL HIGH (ref 70–99)
HCT: 40 % (ref 39.0–52.0)
Hemoglobin: 13.6 g/dL (ref 13.0–17.0)

## 2012-06-19 LAB — POCT I-STAT TROPONIN I: Troponin i, poc: 0 ng/mL (ref 0.00–0.08)

## 2012-06-19 MED ORDER — SODIUM CHLORIDE 0.9 % IV BOLUS (SEPSIS)
1000.0000 mL | Freq: Once | INTRAVENOUS | Status: AC
  Administered 2012-06-19: 1000 mL via INTRAVENOUS

## 2012-06-19 NOTE — ED Provider Notes (Signed)
I saw and evaluated the patient, reviewed the resident's note and I agree with the findings and plan. I saw and evaluated the EKG. I agree with the resident's interpretation.  Patient today with a near syncopal versus syncopal event. He had been working outside for 4-5 hours today when he started sweating heavily and came in and felt lightheaded. Patient is unclear if he lost consciousness but the person with him states he was awake the entire time. He denies any chest pain or shortness of breath. Patient's electrolytes and creatinine are stable. His EKG is unchanged. Mildly orthostatic on exam and given 2 L of fluid. He has no focal exam findings and his pacemaker interrogation showed no abnormal events. Patient will be discharged home  Gwyneth Sprout, MD 06/19/12 1534

## 2012-06-19 NOTE — ED Provider Notes (Signed)
History     CSN: [id number]  Arrival date & time 06/19/12  1302   First MD Initiated Contact with Patient 06/19/12 1324      Chief Complaint  Patient presents with  . Altered Mental Status    HPI:  78 year old man with a history of multiple syncopal episodes and hypertension who presents with syncope again.  His story and the story from EMS are conflicting.  He reports working in the yard starting at 9:30 this morning.  He started sweating profusely and was feeling hot and the next thing he knew EMS was attaching their monitors.  His family member in the room was adamant the patient never lost consciousness.  She says he sat down in a chair and was responsive the entire time.  When I asked the patient again if he passed out, his story changed and he said he thinks he was with it the enitre time.  EMS reports the patient was unresponsive and slumped over in a chair when they arrived, and only started responding after they had inserted and IV and were attaching their monitors.  There did not seem to be a prodrome or warning of impending syncope.  The patient denies dizziness, lightheadedness, chest pain, colds, cough, congestion, dyspnea, abdominal pain, nausea and vomiting.  He does endorse blurry vision during and immediately after the event.  There was no seizure like activity and no loss of bowel or bladder control.   Past Medical History  Diagnosis Date  . Hypertension     Past Surgical History  Procedure Date  . Pacemaker insertion     History reviewed. No pertinent family history.  History  Substance Use Topics  . Smoking status: Never Smoker   . Smokeless tobacco: Not on file  . Alcohol Use: No      Review of Systems  All other systems reviewed and are negative.    Allergies  Review of patient's allergies indicates no known allergies.  Home Medications   Current Outpatient Rx  Name Route Sig Dispense Refill  . ASPIRIN 81 MG PO CHEW Oral Chew 81 mg by mouth  daily.    Marland Kitchen BRIMONIDINE TARTRATE-TIMOLOL 0.2-0.5 % OP SOLN Left Eye Place 1 drop into the left eye every 12 (twelve) hours.    Marland Kitchen LATANOPROST 0.005 % OP SOLN Both Eyes Place 1 drop into both eyes at bedtime.    Marland Kitchen LEVETIRACETAM 250 MG PO TABS Oral Take 250 mg by mouth every 12 (twelve) hours.    Marland Kitchen LISINOPRIL-HYDROCHLOROTHIAZIDE 20-12.5 MG PO TABS Oral Take 1 tablet by mouth daily.      BP 107/69  Pulse 61  Temp 97.7 F (36.5 C) (Oral)  Resp 15  SpO2 99%  Physical Exam  Constitutional: He is oriented to person, place, and time. He appears well-developed and well-nourished. No distress.  HENT:  Head: Normocephalic and atraumatic.  Nose: Nose normal.  Mouth/Throat: Uvula is midline, oropharynx is clear and moist and mucous membranes are normal.  Eyes: Conjunctivae and EOM are normal. Pupils are equal, round, and reactive to light.  Neck: Neck supple. Carotid bruit is not present.  Cardiovascular: Normal rate, regular rhythm and normal heart sounds.   Pulmonary/Chest: Effort normal and breath sounds normal.  Abdominal: Soft. Normal appearance and bowel sounds are normal. He exhibits no mass. There is no tenderness. There is no CVA tenderness.  Lymphadenopathy:    He has no cervical adenopathy.  Neurological: He is alert and oriented to person, place,  and time. GCS eye subscore is 4. GCS verbal subscore is 5. GCS motor subscore is 6.  Skin: Skin is warm, dry and intact.    ED Course  Procedures (including critical care time)   3:01 PM - Pacemaker interrogated.  No events.  3:12 PM - Lying:  BP 136/73, HR 61.  Sitting:  129/70, 65.  Standing:  108/85, 68.  3:15 PM - Pt reassessed.  Hemodynamically stable and resting comfortably.  2nd 1L bolus ordered.   Labs Reviewed  POCT I-STAT, CHEM 8 - Abnormal; Notable for the following:    Creatinine, Ser 1.60 (*)     Glucose, Bld 128 (*)     All other components within normal limits  POCT I-STAT TROPONIN I    EKG Results: Rate:   62 PR:  180 QRS:  80 QTc:  447 EKG: normal EKG, normal sinus rhythm, unchanged from previous tracings, nonspecific ST and T waves changes.   1. Syncope     MDM  1.   Syncope:  In the patient's medical record, there is concern for seizures in the past.  In fact, he was given an rx for Keppra in July when he was seen in the ED, but he has no recollection of this and does not take this medicine.  Today's events would be atypical for seizure but not an impossibility.  More likely is syncope from hypovolemia.  Heat exhaustion/stroke is a concern, but patient is hemodynamically stable and normal temperatures here.  Pacemaker interrogation revealed no events; cardiac arrhythmias are ruled out.  Stroke is unlikely given the history and physical exam.  The patient makes it very clear that he has no intention of staying at the hospital longer than a few hours.  Orthostatic vitals signs after a 1L bolus were positive, so a 2nd 1L bolus was ordered.  2.   Disposition:   Deferred to Dr. Lovenia Shuck, MD 06/19/12 [phone]

## 2012-06-19 NOTE — ED Notes (Addendum)
Pt presents to department for evaluation of altered mental status. Was found slumped over and unresponsive by EMS, were unable to palpate radial pulses. Pt was working outside today and got very hot. Pt began responding when EMS started fluid treatment. Upon arrival pt is alert and answering questions appropriately. 20g R forearm. CBG 115

## 2013-08-08 ENCOUNTER — Encounter: Payer: Self-pay | Admitting: *Deleted

## 2013-08-08 ENCOUNTER — Telehealth: Payer: Self-pay | Admitting: Internal Medicine

## 2013-08-08 NOTE — Telephone Encounter (Signed)
08-08-13 certified letter sent to pt per kristen, pt noncompliant/mt

## 2013-09-01 ENCOUNTER — Ambulatory Visit (INDEPENDENT_AMBULATORY_CARE_PROVIDER_SITE_OTHER): Payer: Medicare Other | Admitting: Cardiology

## 2013-09-01 ENCOUNTER — Encounter: Payer: Self-pay | Admitting: Cardiology

## 2013-09-01 VITALS — BP 176/100 | HR 77 | Ht 67.0 in | Wt 173.0 lb

## 2013-09-01 DIAGNOSIS — I495 Sick sinus syndrome: Secondary | ICD-10-CM

## 2013-09-01 DIAGNOSIS — Z950 Presence of cardiac pacemaker: Secondary | ICD-10-CM

## 2013-09-01 DIAGNOSIS — I10 Essential (primary) hypertension: Secondary | ICD-10-CM

## 2013-09-01 DIAGNOSIS — Z9119 Patient's noncompliance with other medical treatment and regimen: Secondary | ICD-10-CM

## 2013-09-01 DIAGNOSIS — Z91199 Patient's noncompliance with other medical treatment and regimen due to unspecified reason: Secondary | ICD-10-CM

## 2013-09-01 LAB — PACEMAKER DEVICE OBSERVATION
AL AMPLITUDE: 2 mv
AL IMPEDENCE PM: 417 Ohm
ATRIAL PACING PM: 94.1
BATTERY VOLTAGE: 2.73 V
RV LEAD IMPEDENCE PM: 616 Ohm
VENTRICULAR PACING PM: 0.1

## 2013-09-01 MED ORDER — LISINOPRIL-HYDROCHLOROTHIAZIDE 20-12.5 MG PO TABS: 1.0000 | ORAL_TABLET | Freq: Every day | ORAL | Status: DC

## 2013-09-01 NOTE — Progress Notes (Signed)
ELECTROPHYSIOLOGY OFFICE NOTE  Patient ID: Wesley Cook MRN: [id number], DOB/AGE: 1934-01-08   Date of Visit: 09/01/2013  Primary Physician: Dorrene German, MD Primary Cardiologist: Eldridge Dace, MD Reason for Visit: EP/device follow-up  History of Present Illness  Wesley Cook is a 79 y.o. male with sinus node dysfunction s/p PPM implant by Dr. Amil Amen and HTN who presents today for routine electrophysiology followup. He has been lost to follow-up since Nov 2010.   He reports he is doing well and has no complaints. He denies chest pain or shortness of breath. He denies palpitations, dizziness, near syncope or syncope. He denies LE swelling, orthopnea, PND or recent weight gain. He is not compliant with medications and reports he stopped taking his medications one year ago because he "couldn't get any refills."  Past Medical History Past Medical History  Diagnosis Date  . Hypertension   . Sinus node dysfunction     s/p PPM implant 2007 (Medtronic)  . Normal coronary arteries 2007    s/p cardiac cath prior to [facility] implant    Past Surgical History Past Surgical History  Procedure Laterality Date  . Pacemaker insertion    . Insert / replace / remove pacemaker    . Cardiac catheterization  2007    normal coronaries    Allergies/Intolerances No Known Allergies  Current Home Medications Current Outpatient Prescriptions  Medication Sig Dispense Refill  . aspirin 81 MG chewable tablet Chew 81 mg by mouth daily.      . brimonidine-timolol (COMBIGAN) 0.2-0.5 % ophthalmic solution Place 1 drop into the left eye every 12 (twelve) hours.      Marland Kitchen latanoprost (XALATAN) 0.005 % ophthalmic solution Place 1 drop into both eyes at bedtime.      Marland Kitchen lisinopril-hydrochlorothiazide (PRINZIDE,ZESTORETIC) 20-12.5 MG per tablet Take 1 tablet by mouth daily.  30 tablet  2   No current facility-administered medications for this visit.    Social History Social History  . Marital Status: Single    Social History Main Topics  . Smoking status: Never Smoker   . Smokeless tobacco: No  . Alcohol Use: No  . Drug Use: No   Review of Systems General: No chills, fever, night sweats or weight changes Cardiovascular: No chest pain, dyspnea on exertion, edema, orthopnea, palpitations, paroxysmal nocturnal dyspnea Dermatological: No rash, lesions or masses Respiratory: No cough, dyspnea Urologic: No hematuria, dysuria Abdominal: No nausea, vomiting, diarrhea, bright red blood per rectum, melena, or hematemesis Neurologic: No visual changes, weakness, changes in mental status All other systems reviewed and are otherwise negative except as noted above.  Physical Exam Vitals: Blood pressure 176/100, pulse 77, height 5\' 7"  (1.702 m), weight 173 lb (78.472 kg), SpO2 97.00%.  General: Well developed, well appearing 79 y.o. male in no acute distress. HEENT: Normocephalic, atraumatic. EOMs intact. Sclera nonicteric. Oropharynx clear.  Neck: Supple. No JVD. Lungs: Respirations regular and unlabored, CTA bilaterally. No wheezes, rales or rhonchi. Heart: RRR. S1, S2 present. No murmurs, rub, S3 or S4. Abdomen: Soft, non-distended.  Extremities: No clubbing, cyanosis or edema. PT/Radials 2+ and equal bilaterally. Psych: Normal affect. Neuro: Alert and oriented X 3. Moves all extremities spontaneously. Skin: Pacemaker pocket well healed.   Diagnostics Device interrogation today - Normal device function. Thresholds, sensing, impedances consistent with previous measurements. Device programmed to maximize longevity. No high atrial or high ventricular rates noted. Device programmed at appropriate safety margins. Histogram distribution appropriate for patient activity level. Device programmed to optimize intrinsic conduction. Estimated longevity 2 years (25  months).  Assessment and Plan 1. Sinus node dysfunction s/p PPM implant 2007 Normal device function No programming changes made Start remote PPM  checks every 3 months (ordered transmitter) Return to clinic for follow-up with Dr. Johney Frame in one year  2. HTN Poorly controlled due to medical noncompliance Restart lisinopril-hctz 20-12.5 once daily Check BP and BMET in one week  3. Medical noncompliance Counseled regarding importance of compliance with medications and follow-up  Signed, Rick Duff, PA-C 09/01/2013, 4:37 PM

## 2013-09-01 NOTE — Patient Instructions (Signed)
Your physician wants you to follow-up in: 6 MONTHS WITH DR. VARANASAI You will receive a reminder letter in the mail two months in advance. If you don't receive a letter, please call our office to schedule the follow-up appointment.  Your physician recommends that you schedule a follow-up appointment in: 3 MONTHS PACEMAKER REMOTE CHECK  Your physician recommends that you continue on your current medications as directed. Please refer to the Current Medication list given to you today.

## 2013-09-05 ENCOUNTER — Telehealth: Payer: Self-pay | Admitting: *Deleted

## 2013-09-05 NOTE — Telephone Encounter (Signed)
Spoke to pt to inform him that per Adella Hare to come back to office to get his blood pressure checked and get bmet. pt agreed to time and date October 31 at 11am

## 2013-09-08 ENCOUNTER — Ambulatory Visit (INDEPENDENT_AMBULATORY_CARE_PROVIDER_SITE_OTHER): Payer: Medicare Other | Admitting: Internal Medicine

## 2013-09-08 ENCOUNTER — Other Ambulatory Visit: Payer: Self-pay | Admitting: *Deleted

## 2013-09-08 ENCOUNTER — Ambulatory Visit (INDEPENDENT_AMBULATORY_CARE_PROVIDER_SITE_OTHER): Payer: Medicare Other | Admitting: *Deleted

## 2013-09-08 ENCOUNTER — Other Ambulatory Visit: Payer: Medicare Other

## 2013-09-08 VITALS — BP 142/70 | HR 72 | Wt 170.0 lb

## 2013-09-08 DIAGNOSIS — I10 Essential (primary) hypertension: Secondary | ICD-10-CM

## 2013-09-08 LAB — BASIC METABOLIC PANEL
BUN: 26 mg/dL — ABNORMAL HIGH (ref 6–23)
CO2: 29 mEq/L (ref 19–32)
Chloride: 99 mEq/L (ref 96–112)
Creatinine, Ser: 1.4 mg/dL (ref 0.4–1.5)
Glucose, Bld: 186 mg/dL — ABNORMAL HIGH (ref 70–99)
Potassium: 3.7 mEq/L (ref 3.5–5.1)

## 2013-09-08 NOTE — Progress Notes (Signed)
In for lab work and BP check.  BP today was 142/70; HR 72.  Started on Lisinopril-HCTZ 20/12.5 by Dominga Ferry, PA on 10/24.  Reviewed with [facility] and she wants him to continue same medication.  Was pleased with BP reading.  Advised pt that he would be called with lab results and to continue on same medication.  Advised also that he should be monitoring BP at home.  States he has a cuff but needs batteries.  Will get batteries replaced.

## 2013-09-21 ENCOUNTER — Encounter: Payer: Self-pay | Admitting: Internal Medicine

## 2013-12-04 ENCOUNTER — Encounter: Payer: Medicare Other | Admitting: *Deleted

## 2013-12-15 ENCOUNTER — Encounter: Payer: Self-pay | Admitting: *Deleted

## 2014-01-02 ENCOUNTER — Other Ambulatory Visit: Payer: Self-pay | Admitting: Cardiology

## 2014-02-05 ENCOUNTER — Other Ambulatory Visit: Payer: Self-pay | Admitting: Interventional Cardiology

## 2014-03-06 ENCOUNTER — Other Ambulatory Visit: Payer: Self-pay | Admitting: Interventional Cardiology

## 2014-04-03 ENCOUNTER — Other Ambulatory Visit: Payer: Self-pay | Admitting: Interventional Cardiology

## 2014-04-04 LAB — PROTIME-INR: INR: 1.1 (ref 0.9–1.1)

## 2014-04-10 ENCOUNTER — Encounter: Payer: Self-pay | Admitting: Cardiology

## 2014-04-16 ENCOUNTER — Encounter: Payer: Self-pay | Admitting: Interventional Cardiology

## 2014-04-16 ENCOUNTER — Ambulatory Visit (INDEPENDENT_AMBULATORY_CARE_PROVIDER_SITE_OTHER): Payer: Medicare Other | Admitting: Interventional Cardiology

## 2014-04-16 VITALS — BP 140/86 | HR 68 | Ht 66.0 in | Wt 176.6 lb

## 2014-04-16 DIAGNOSIS — Z13220 Encounter for screening for lipoid disorders: Secondary | ICD-10-CM

## 2014-04-16 DIAGNOSIS — R55 Syncope and collapse: Secondary | ICD-10-CM

## 2014-04-16 DIAGNOSIS — I10 Essential (primary) hypertension: Secondary | ICD-10-CM

## 2014-04-16 DIAGNOSIS — Z515 Encounter for palliative care: Secondary | ICD-10-CM | POA: Insufficient documentation

## 2014-04-16 DIAGNOSIS — Z950 Presence of cardiac pacemaker: Secondary | ICD-10-CM | POA: Insufficient documentation

## 2014-04-16 LAB — COMPREHENSIVE METABOLIC PANEL
ALBUMIN: 3.7 g/dL (ref 3.5–5.2)
ALT: 32 U/L (ref 0–53)
AST: 29 U/L (ref 0–37)
Alkaline Phosphatase: 65 U/L (ref 39–117)
BUN: 14 mg/dL (ref 6–23)
CALCIUM: 9.5 mg/dL (ref 8.4–10.5)
CHLORIDE: 101 meq/L (ref 96–112)
CO2: 32 mEq/L (ref 19–32)
CREATININE: 1.2 mg/dL (ref 0.4–1.5)
GFR: 72.5 mL/min (ref >60.00)
Glucose, Bld: 90 mg/dL (ref 70–99)
POTASSIUM: 4.5 meq/L (ref 3.5–5.1)
Sodium: 138 mEq/L (ref 135–145)
Total Bilirubin: 0.5 mg/dL (ref 0.2–1.2)
Total Protein: 6.7 g/dL (ref 6.0–8.3)

## 2014-04-16 LAB — LIPID PANEL
Cholesterol: 170 mg/dL (ref 0–200)
HDL: 52.2 mg/dL (ref >39.00)
LDL CALC: 106 mg/dL — AB (ref 0–99)
NonHDL: 117.8
Total CHOL/HDL Ratio: 3
Triglycerides: 60 mg/dL (ref 0.0–149.0)
VLDL: 12 mg/dL (ref 0.0–40.0)

## 2014-04-16 NOTE — Patient Instructions (Signed)
Your physician recommends that you return for a FASTING lipid profile and cmet today.  Your physician wants you to follow-up in: 1 year with Dr. Eldridge Dace. You will receive a reminder letter in the mail two months in advance. If you don't receive a letter, please call our office to schedule the follow-up appointment.

## 2014-04-16 NOTE — Progress Notes (Signed)
Patient ID: Wesley Cook, male   DOB: 1934-09-15, 80 y.o.   MRN: [id number]    [address] [place], [place]  [address] Phone: [phone] Fax:  [phone]  Date:  04/16/2014   ID:  Wesley Cook, DOB October 11, 1934, MRN [id number]  PCP:  Dorrene German, MD      History of Present Illness: Wesley Cook is a 80 y.o. male with HTN and a pacemaker.  Pacemaker placed in 2007. He had cardiac cath showing no significant disease at that time.  Pt felt dizzy and passed out last week after working outside for several hours on a very hot day.. Daughter had to call 911. Took him to the hospital. They want him to be admitted but he refused. Since then, denies dizziness, syncope, palpitations, chest pain, dyspnea, orthopnea, PND, LE swelling. Has not taking low-dose ASA or cholesterol medicine for years.  Walks about 60 blocks occasionally. Does yard work w/o dyspnea and chest pain. BP is well controlled at home, around 140s/80s.   Wt Readings from Last 3 Encounters:  04/16/14 176 lb 9.6 oz (80.105 kg)  09/08/13 170 lb (77.111 kg)  09/01/13 173 lb (78.472 kg)     Past Medical History  Diagnosis Date  . Hypertension   . Sinus node dysfunction     s/p PPM implant 2007 (Medtronic)  . Normal coronary arteries 2007    s/p cardiac cath prior to [facility] implant    Current Outpatient Prescriptions  Medication Sig Dispense Refill  . aspirin 81 MG chewable tablet Chew 81 mg by mouth daily.      Marland Kitchen lisinopril-hydrochlorothiazide (PRINZIDE,ZESTORETIC) 20-12.5 MG per tablet TAKE 1 TABLET BY MOUTH EVERY DAY  30 tablet  1   No current facility-administered medications for this visit.    Allergies:   No Known Allergies  Social History:  The patient  reports that he has never smoked. He does not have any smokeless tobacco history on file. He reports that he does not drink alcohol or use illicit drugs.   Family History:  The patient's family history is not on file.   ROS:  Please see the  history of present illness.  No nausea, vomiting.  No fevers, chills.  No focal weakness.  No dysuria.    All other systems reviewed and negative.   PHYSICAL EXAM: VS:  BP 140/86  Pulse 68  Ht 5\' 6"  (1.676 m)  Wt 176 lb 9.6 oz (80.105 kg)  BMI 28.52 kg/m2 Well nourished, well developed, in no acute distress HEENT: normal Neck: no JVD, no carotid bruits Cardiac:  normal S1, S2; RRR;  Lungs:  clear to auscultation bilaterally, no wheezing, rhonchi or rales Abd: soft, nontender, no hepatomegaly Ext: no edema Skin: warm and dry Neuro:   no focal abnormalities noted  EKG:  Atrial pacing, narow QRS, NSST     ASSESSMENT AND PLAN:  Cardiac pacemaker in situ /syncope  No further syncope since last week.  He feels fine and thinks episode was related to being out in the heat. He will have a pacemaker check in the office.  Will check with the pacemaker clinic at this needs to be expedited based on the schedule.  Encouraged him to stay well hydrated, particularly when he works outside.  2. Hypercholesteremia, pure   will recheck today. He was previously on lovastatin.  3. Essential hypertension, benign  Well-controlled. Continue current medicines. He was concerned that he requires to medicines. I reassured him that this is common.  Signed, Fredric MareJay S. Varanasi, MD, [facility] CenterFACC 04/16/2014 10:37 AM

## 2014-04-19 ENCOUNTER — Other Ambulatory Visit: Payer: Self-pay | Admitting: Cardiology

## 2014-05-08 ENCOUNTER — Encounter: Payer: Self-pay | Admitting: *Deleted

## 2014-09-14 ENCOUNTER — Encounter: Payer: Self-pay | Admitting: Internal Medicine

## 2014-09-19 ENCOUNTER — Encounter: Payer: Self-pay | Admitting: Internal Medicine

## 2015-01-22 ENCOUNTER — Ambulatory Visit (INDEPENDENT_AMBULATORY_CARE_PROVIDER_SITE_OTHER): Payer: Medicare HMO | Admitting: *Deleted

## 2015-01-22 ENCOUNTER — Ambulatory Visit (INDEPENDENT_AMBULATORY_CARE_PROVIDER_SITE_OTHER): Payer: Medicare HMO | Admitting: Interventional Cardiology

## 2015-01-22 ENCOUNTER — Encounter: Payer: Self-pay | Admitting: Interventional Cardiology

## 2015-01-22 VITALS — BP 134/76 | HR 74 | Ht 66.0 in | Wt 174.0 lb

## 2015-01-22 DIAGNOSIS — I10 Essential (primary) hypertension: Secondary | ICD-10-CM

## 2015-01-22 DIAGNOSIS — Z950 Presence of cardiac pacemaker: Secondary | ICD-10-CM

## 2015-01-22 DIAGNOSIS — I495 Sick sinus syndrome: Secondary | ICD-10-CM

## 2015-01-22 LAB — MDC_IDC_ENUM_SESS_TYPE_INCLINIC
Battery Impedance: 3537 Ohm
Battery Remaining Longevity: 13 mo
Battery Voltage: 2.69 V
Brady Statistic AP VP Percent: 0 %
Brady Statistic AS VS Percent: 8 %
Date Time Interrogation Session: 20160315170854
Lead Channel Pacing Threshold Pulse Width: 0.4 ms
Lead Channel Pacing Threshold Pulse Width: 0.4 ms
Lead Channel Sensing Intrinsic Amplitude: 4 mV
Lead Channel Setting Pacing Amplitude: 1.5 V
Lead Channel Setting Sensing Sensitivity: 5.6 mV
MDC IDC MSMT LEADCHNL RA IMPEDANCE VALUE: 434 Ohm
MDC IDC MSMT LEADCHNL RA PACING THRESHOLD AMPLITUDE: 0.5 V
MDC IDC MSMT LEADCHNL RV IMPEDANCE VALUE: 692 Ohm
MDC IDC MSMT LEADCHNL RV PACING THRESHOLD AMPLITUDE: 0.5 V
MDC IDC MSMT LEADCHNL RV SENSING INTR AMPL: 22.4 mV
MDC IDC SET LEADCHNL RV PACING AMPLITUDE: 2 V
MDC IDC SET LEADCHNL RV PACING PULSEWIDTH: 0.4 ms
MDC IDC STAT BRADY AP VS PERCENT: 92 %
MDC IDC STAT BRADY AS VP PERCENT: 0 %

## 2015-01-22 NOTE — Progress Notes (Signed)
Pacemaker check in clinic. Normal device function. Thresholds, sensing, impedances consistent with previous measurements. Device programmed to maximize longevity. 2 mode switches both < 1 minute.  No high ventricular rates noted. Device programmed at appropriate safety margins. Histogram distribution appropriate for patient activity level. Device programmed to optimize intrinsic conduction. Estimated longevity <2-24 months with an average of 13 months. Patient has been lost to follow up since 2014 with several attempts to reach him.  ROV 1 month with the Device Clinic for battery check.

## 2015-01-22 NOTE — Progress Notes (Signed)
Patient ID: Wesley Cook, male   DOB: 1934/07/10, 81 y.o.   MRN: [id number]    [address] [place], [place]  [address] Phone: [phone] Fax:  [phone]  Date:  01/22/2015   ID:  Wesley Cook, DOB Sep 07, 1934, MRN [id number]  PCP:  Geraldo Pitter, MD      History of Present Illness: Wesley Cook is a 81 y.o. male with HTN and a pacemaker.  Pacemaker placed in 2007. He had cardiac cath showing no significant disease at that time. No further syncope.Wife reports memory issues with the patient.    No dizziness, syncope, palpitations, chest pain, dyspnea, orthopnea, PND, LE swelling. Has not taking low-dose ASA or cholesterol medicine for years.   Does minimal yard work now w/o dyspnea and chest pain. He moved, so there is less yard work to do . BP is well controlled at home, around 140s/80s.  He walks short distances.    Wt Readings from Last 3 Encounters:  01/22/15 174 lb (78.926 kg)  04/16/14 176 lb 9.6 oz (80.105 kg)  09/08/13 170 lb (77.111 kg)     Past Medical History  Diagnosis Date  . Hypertension   . Sinus node dysfunction     s/p PPM implant 2007 (Medtronic)  . Normal coronary arteries 2007    s/p cardiac cath prior to [facility] implant    Current Outpatient Prescriptions  Medication Sig Dispense Refill  . lisinopril-hydrochlorothiazide (PRINZIDE,ZESTORETIC) 20-12.5 MG per tablet TAKE 1 TABLET BY MOUTH EVERY DAY 30 tablet 1  . timolol (TIMOPTIC) 0.5 % ophthalmic solution Place 1 drop into both eyes daily.   6   No current facility-administered medications for this visit.    Allergies:   No Known Allergies  Social History:  The patient  reports that he has never smoked. He does not have any smokeless tobacco history on file. He reports that he does not drink alcohol or use illicit drugs.   Family History:  The patient's family history includes Cancer in his father.   ROS:  Please see the history of present illness.  No nausea, vomiting.  No fevers,  chills.  No focal weakness.  No dysuria.    All other systems reviewed and negative.   PHYSICAL EXAM: VS:  BP 134/76 mmHg  Pulse 74  Ht  (1.676 m)  Wt 174 lb (78.926 kg)  BMI 28.10 kg/m2 Well nourished, well developed, in no acute distress HEENT: normal Neck: no JVD, no carotid bruits Cardiac:  normal S1, S2; RRR;  Lungs:  clear to auscultation bilaterally, no wheezing, rhonchi or rales Abd: soft, nontender, no hepatomegaly Ext: no edema Skin: warm and dry Neuro:   no focal abnormalities noted Psych: normal affect  EKG:  Atrial pacing, narow QRS, NSST     ASSESSMENT AND PLAN:  Cardiac pacemaker in situ /syncope  No further syncope.  Needs check with the pacemaker clinic to check battery life.  Encouraged him to stay well hydrated, particularly when he works outside. Pacer check done showed 2 months of battery life remaining.  Plan EP referral. 2. Hypercholesteremia, pure    He was previously on lovastatin.  He will need labs at some point.  He will be seeing a new PMD, Dr. Renaye Rakers.  3. Essential hypertension, benign  Well-controlled. Continue current medicines. He was concerned that he requires to medicines. I reassured him that this is common.   I asked them to f/u with Dr. Parke Simmers regarding concerns of early dementia.  Signed, Fredric MareJay S. Whisper Kurka, MD, [facility] Creek[facility]ACC 01/22/2015 4:40 PM

## 2015-01-22 NOTE — Patient Instructions (Signed)
Your physician recommends that you continue on your current medications as directed. Please refer to the Current Medication list given to you today.  Your physician recommends that you schedule a follow-up appointment in: 1 month with device clinic.  Your physician recommends that you schedule a follow-up appointment with an EP doctor ASAP.  Your physician wants you to follow-up in: 1 year with Dr. Eldridge DaceVaranasi.  You will receive a reminder letter in the mail two months in advance. If you don't receive a letter, please call our office to schedule the follow-up appointment.

## 2015-01-29 ENCOUNTER — Encounter: Payer: Self-pay | Admitting: Nurse Practitioner

## 2015-01-29 DIAGNOSIS — I495 Sick sinus syndrome: Secondary | ICD-10-CM | POA: Insufficient documentation

## 2015-01-29 NOTE — Progress Notes (Signed)
Electrophysiology Office Note Date: 01/30/2015  ID:  Wesley Cook, DOB 08-06-1934, MRN [id number]  PCP: Geraldo Pitter, MD Primary Cardiologist: Eldridge Dace Electrophysiologist: Allred  CC: Pacemaker follow-up  Wesley Cook is a 81 y.o. male is seen today for Dr Johney Frame.  He presents today for add-on electrophysiology followup.  Since last being seen in our clinic, the patient reports doing very well.  He denies chest pain, palpitations, dyspnea, PND, orthopnea, nausea, vomiting, dizziness, syncope, edema.  He does report daytime somnolence and sleep disordered breathing. He was seen at urgent care recently for wheezing and was given an inhaler and prednisone dose pack.  Breathing has improved since then.   The patient was has been lost to device follow-up several times.  He was last seen in the device clinic 08/2013 by Rick Duff, PA who recommended 1 year follow-up with Dr Johney Frame.  Prior to that visit, he had not been seen since 2010.  He was recently seen by Dr Eldridge Dace and device interrogation demonstrated pacemaker nearing ERI which prompted visit today.   Device History: MDT dual chamber PPM implanted 2007 for sinus node dysfunction by Dr Amil Amen   Past Medical History  Diagnosis Date  . Hypertension   . Sinus node dysfunction     a. s/p MDT dual chamber pacemaker implanted by Dr Amil Amen   Past Surgical History  Procedure Laterality Date  . Pacemaker insertion  2007    MDT dual chamber pacemaker implanted for sinus node dysfunction by Dr Ty Hilts  . Cardiac catheterization  2007    normal coronaries    Current Outpatient Prescriptions  Medication Sig Dispense Refill  . lisinopril-hydrochlorothiazide (PRINZIDE,ZESTORETIC) 20-12.5 MG per tablet TAKE 1 TABLET BY MOUTH EVERY DAY 30 tablet 1  . timolol (TIMOPTIC) 0.5 % ophthalmic solution Place 1 drop into both eyes daily.   6   No current facility-administered medications for this visit.    Allergies:   Review of  patient's allergies indicates no known allergies.   Social History: History   Social History  . Marital Status: Single    Spouse Name: N/A  . Number of Children: N/A  . Years of Education: N/A   Occupational History  . Not on file.   Social History Main Topics  . Smoking status: Never Smoker   . Smokeless tobacco: Not on file  . Alcohol Use: No  . Drug Use: No  . Sexual Activity: Not on file   Other Topics Concern  . Not on file   Social History Narrative    Family History: Family History  Problem Relation Age of Onset  . Cancer Father      Review of Systems: General: No chills, fever, night sweats or weight changes  Cardiovascular:  No chest pain, dyspnea on exertion, edema, orthopnea, palpitations, paroxysmal nocturnal dyspnea  Dermatological: No rash, lesions or masses Respiratory: No cough, dyspnea Urologic: No hematuria, dysuria Abdominal: No nausea, vomiting, diarrhea, bright red blood per rectum, melena, or hematemesis Neurologic: No visual changes, weakness, changes in mental status All other systems reviewed and are otherwise negative except as noted above.   Physical Exam: VS:  BP 142/76 mmHg  Pulse 68  Ht  (1.676 m)  Wt 178 lb (80.74 kg)  BMI 28.74 kg/m2 , BMI Body mass index is 28.74 kg/(m^2).  GEN- The patient is well appearing, alert and oriented x 3 today.   HEENT: normocephalic, atraumatic; sclera clear, conjunctiva pink; hearing intact; oropharynx clear; neck supple, no JVP Lymph- no cervical  lymphadenopathy Lungs- normal work of breathing, scattered rhonchi Heart- Regular rate and rhythm, no murmurs, rubs or gallops, PMI not laterally displaced GI- soft, non-tender, non-distended, bowel sounds present, no hepatosplenomegaly Extremities- no clubbing, cyanosis, or edema; DP/PT/radial pulses 2+ bilaterally MS- no significant deformity or atrophy Skin- warm and dry, no rash or lesion; PPM pocket well healed Psych- euthymic mood, full  affect Neuro- strength and sensation are intact  PPM Interrogation- reviewed in detail today,  See PACEART report  EKG:  EKG is not ordered today.  Recent Labs: 04/16/2014: ALT 32; BUN 14; Creatinine 1.2; Potassium 4.5; Sodium 138   Wt Readings from Last 3 Encounters:  01/30/15 178 lb (80.74 kg)  01/22/15 174 lb (78.926 kg)  04/16/14 176 lb 9.6 oz (80.105 kg)     Other studies Reviewed: Additional studies/ records that were reviewed today include: Dr Hoyle BarrVaranasi's office notes   Assessment and Plan:  1.  Symptomatic sinus node dysfunction Normal PPM function Estimated longevity 14 months See Arita Missace Art report No changes today Echocardiogram 03/2014 demonstrated normal EF Patient declines remote follow-up at this time. Pt aware of need for compliance with pacemaker follow-up.  Will check in the device clinic in 3 months and follow up with Dr Johney FrameAllred in 6 months.  Pacemaker generator change procedure reviewed with patient today.     2.  HTN Continue Lisinopril/HCTZ Recheck BP 142/76. Pt reports compliance with medication.  Advised to follow low salt diet and follow blood pressures at home.    3.  Sleep disordered breathing Patient reports daytime somnolence and his family reports snoring.  They are unsure of apnea episodes.  Asked patient to discuss need for sleep study with primary care physician.    Current medicines are reviewed at length with the patient today.   The patient does not have concerns regarding his medicines.  The following changes were made today:  none  Labs/ tests ordered today include: none   Disposition:   Follow up with device clinic 3 months.  Follow up with Dr Johney FrameAllred 6 months.    Signed, Gypsy BalsamAmber Seiler, NP 01/30/2015 3:22 PM  [facility] [address] [place] [phone] (office) [phone] (fax

## 2015-01-30 ENCOUNTER — Ambulatory Visit (INDEPENDENT_AMBULATORY_CARE_PROVIDER_SITE_OTHER): Payer: Medicare HMO | Admitting: Nurse Practitioner

## 2015-01-30 ENCOUNTER — Encounter: Payer: Self-pay | Admitting: Nurse Practitioner

## 2015-01-30 VITALS — BP 142/76 | HR 68 | Ht 66.0 in | Wt 178.0 lb

## 2015-01-30 DIAGNOSIS — I495 Sick sinus syndrome: Secondary | ICD-10-CM

## 2015-01-30 DIAGNOSIS — I10 Essential (primary) hypertension: Secondary | ICD-10-CM

## 2015-01-30 LAB — MDC_IDC_ENUM_SESS_TYPE_INCLINIC
Battery Impedance: 3478 Ohm
Battery Remaining Longevity: 14 mo
Battery Voltage: 2.68 V
Brady Statistic AP VP Percent: 0 %
Brady Statistic AP VS Percent: 93 %
Brady Statistic AS VP Percent: 0 %
Date Time Interrogation Session: 20160323140340
Lead Channel Impedance Value: 673 Ohm
Lead Channel Setting Pacing Amplitude: 2 V
Lead Channel Setting Sensing Sensitivity: 5.6 mV
MDC IDC MSMT LEADCHNL RA IMPEDANCE VALUE: 425 Ohm
MDC IDC SET LEADCHNL RA PACING AMPLITUDE: 1.5 V
MDC IDC SET LEADCHNL RV PACING PULSEWIDTH: 0.4 ms
MDC IDC STAT BRADY AS VS PERCENT: 7 %

## 2015-01-30 NOTE — Patient Instructions (Signed)
Your physician recommends that you schedule a follow-up appointment in: 3 months with the device clinic  Your physician wants you to follow-up in: 6 months with Dr. Johney FrameAllred. You will receive a reminder letter in the mail two months in advance. If you don't receive a letter, please call our office to schedule the follow-up appointment.

## 2015-02-08 ENCOUNTER — Encounter: Payer: Self-pay | Admitting: Internal Medicine

## 2015-02-21 ENCOUNTER — Encounter: Payer: Self-pay | Admitting: Internal Medicine

## 2015-04-09 ENCOUNTER — Emergency Department (HOSPITAL_COMMUNITY)
Admission: EM | Admit: 2015-04-09 | Discharge: 2015-04-10 | Disposition: A | Discharge status: Home / Self Care | Payer: Medicare HMO | Source: Home / Self Care | Attending: Emergency Medicine | Admitting: Emergency Medicine

## 2015-04-09 ENCOUNTER — Encounter (HOSPITAL_COMMUNITY): Payer: Self-pay

## 2015-04-09 ENCOUNTER — Emergency Department (HOSPITAL_COMMUNITY): Payer: Medicare HMO

## 2015-04-09 DIAGNOSIS — Z9114 Patient's other noncompliance with medication regimen: Secondary | ICD-10-CM | POA: Diagnosis present

## 2015-04-09 DIAGNOSIS — N179 Acute kidney failure, unspecified: Secondary | ICD-10-CM | POA: Diagnosis present

## 2015-04-09 DIAGNOSIS — I10 Essential (primary) hypertension: Secondary | ICD-10-CM | POA: Insufficient documentation

## 2015-04-09 DIAGNOSIS — N183 Chronic kidney disease, stage 3 (moderate): Secondary | ICD-10-CM | POA: Diagnosis present

## 2015-04-09 DIAGNOSIS — I480 Paroxysmal atrial fibrillation: Secondary | ICD-10-CM | POA: Diagnosis present

## 2015-04-09 DIAGNOSIS — R1030 Lower abdominal pain, unspecified: Secondary | ICD-10-CM | POA: Insufficient documentation

## 2015-04-09 DIAGNOSIS — D696 Thrombocytopenia, unspecified: Secondary | ICD-10-CM | POA: Diagnosis present

## 2015-04-09 DIAGNOSIS — Z8673 Personal history of transient ischemic attack (TIA), and cerebral infarction without residual deficits: Secondary | ICD-10-CM

## 2015-04-09 DIAGNOSIS — R569 Unspecified convulsions: Secondary | ICD-10-CM | POA: Diagnosis not present

## 2015-04-09 DIAGNOSIS — Z950 Presence of cardiac pacemaker: Secondary | ICD-10-CM

## 2015-04-09 DIAGNOSIS — I6523 Occlusion and stenosis of bilateral carotid arteries: Secondary | ICD-10-CM | POA: Diagnosis present

## 2015-04-09 DIAGNOSIS — F0390 Unspecified dementia without behavioral disturbance: Secondary | ICD-10-CM

## 2015-04-09 DIAGNOSIS — R112 Nausea with vomiting, unspecified: Secondary | ICD-10-CM | POA: Insufficient documentation

## 2015-04-09 DIAGNOSIS — I358 Other nonrheumatic aortic valve disorders: Secondary | ICD-10-CM | POA: Diagnosis present

## 2015-04-09 DIAGNOSIS — G9340 Encephalopathy, unspecified: Secondary | ICD-10-CM | POA: Diagnosis not present

## 2015-04-09 DIAGNOSIS — Z79899 Other long term (current) drug therapy: Secondary | ICD-10-CM

## 2015-04-09 DIAGNOSIS — H5442 Blindness, left eye, normal vision right eye: Secondary | ICD-10-CM | POA: Diagnosis present

## 2015-04-09 DIAGNOSIS — E785 Hyperlipidemia, unspecified: Secondary | ICD-10-CM | POA: Diagnosis present

## 2015-04-09 DIAGNOSIS — H5462 Unqualified visual loss, left eye, normal vision right eye: Secondary | ICD-10-CM | POA: Diagnosis not present

## 2015-04-09 DIAGNOSIS — I129 Hypertensive chronic kidney disease with stage 1 through stage 4 chronic kidney disease, or unspecified chronic kidney disease: Secondary | ICD-10-CM | POA: Diagnosis present

## 2015-04-09 LAB — COMPREHENSIVE METABOLIC PANEL
ALBUMIN: 4 g/dL (ref 3.5–5.0)
ALT: 22 U/L (ref 17–63)
ANION GAP: 13 (ref 5–15)
AST: 26 U/L (ref 15–41)
Alkaline Phosphatase: 59 U/L (ref 38–126)
BUN: 27 mg/dL — ABNORMAL HIGH (ref 6–20)
CALCIUM: 9.2 mg/dL (ref 8.9–10.3)
CO2: 23 mmol/L (ref 22–32)
CREATININE: 1.93 mg/dL — AB (ref 0.61–1.24)
Chloride: 101 mmol/L (ref 101–111)
GFR calc Af Amer: 36 mL/min — ABNORMAL LOW (ref >60)
GFR calc non Af Amer: 31 mL/min — ABNORMAL LOW (ref >60)
Glucose, Bld: 107 mg/dL — ABNORMAL HIGH (ref 65–99)
Potassium: 4 mmol/L (ref 3.5–5.1)
SODIUM: 137 mmol/L (ref 135–145)
TOTAL PROTEIN: 7 g/dL (ref 6.5–8.1)
Total Bilirubin: 0.8 mg/dL (ref 0.3–1.2)

## 2015-04-09 LAB — URINALYSIS, ROUTINE W REFLEX MICROSCOPIC
Bilirubin Urine: NEGATIVE
Glucose, UA: NEGATIVE mg/dL
HGB URINE DIPSTICK: NEGATIVE
Ketones, ur: 15 mg/dL — AB
LEUKOCYTES UA: NEGATIVE
Nitrite: NEGATIVE
PH: 6 (ref 5.0–8.0)
Protein, ur: NEGATIVE mg/dL
SPECIFIC GRAVITY, URINE: 1.02 (ref 1.005–1.030)
Urobilinogen, UA: 1 mg/dL (ref 0.0–1.0)

## 2015-04-09 LAB — LIPASE, BLOOD: Lipase: 24 U/L (ref 22–51)

## 2015-04-09 MED ORDER — SODIUM CHLORIDE 0.9 % IV BOLUS (SEPSIS)
1000.0000 mL | Freq: Once | INTRAVENOUS | Status: AC
  Administered 2015-04-09: 1000 mL via INTRAVENOUS

## 2015-04-09 MED ORDER — IOHEXOL 300 MG/ML  SOLN
25.0000 mL | Freq: Once | INTRAMUSCULAR | Status: AC | PRN
  Administered 2015-04-09: 25 mL via ORAL

## 2015-04-09 NOTE — ED Provider Notes (Signed)
CSN: [id number]     Arrival date & time 04/09/15  2006 History   First MD Initiated Contact with Patient 04/09/15 2020     Chief Complaint  Patient presents with  . Abdominal Pain   Level V Caveat: Dementia   Wesley Cook is a 81 y.o. male with a history of dementia, hypertension and sinus node dysfunction with pacemaker who presents to the emergency department with his ex-wife complaining of lower abdominal pain and vomiting since 3 PM today. Patient currently complains of 4 out of 10 low abdominal pain, but denies current nausea. His ex-wife reports that he is vomited twice and she has seen no blood in his vomit. The patient reports his abdominal pain is gradually getting better. He reports his last vomiting was today and was normal. Denies any diarrhea. He denies history of abdominal surgeries. The wife reports that he was found lying on the ground in the bathroom but she does not believe he fell. The patient does not remember the event. The patient reports he is vomiting up undigested food. The patient denies fevers, chills, chest pain, shortness of breath, cough, hematemesis, hematochezia, diarrhea, urinary symptoms, hematuria, headache, lightheadedness or dizziness.  (Consider location/radiation/quality/duration/timing/severity/associated sxs/prior Treatment) HPI  Past Medical History  Diagnosis Date  . Hypertension   . Sinus node dysfunction     a. s/p MDT dual chamber pacemaker implanted by Dr Amil Amen   Past Surgical History  Procedure Laterality Date  . Pacemaker insertion  2007    MDT dual chamber pacemaker implanted for sinus node dysfunction by Dr Ty Hilts  . Cardiac catheterization  2007    normal coronaries   Family History  Problem Relation Age of Onset  . Cancer Father    History  Substance Use Topics  . Smoking status: Never Smoker   . Smokeless tobacco: Not on file  . Alcohol Use: No    Review of Systems  Unable to perform ROS: Dementia  Constitutional:  Negative for fever.  Eyes: Negative for visual disturbance.  Respiratory: Negative for cough and shortness of breath.   Cardiovascular: Negative for chest pain and palpitations.  Gastrointestinal: Positive for nausea, vomiting and abdominal pain. Negative for diarrhea and blood in stool.  Genitourinary: Negative for dysuria, hematuria and difficulty urinating.  Musculoskeletal: Negative for back pain and neck pain.  Skin: Negative for rash.  Neurological: Negative for dizziness, facial asymmetry, speech difficulty, weakness, light-headedness, numbness and headaches.      Allergies  Review of patient's allergies indicates no known allergies.  Home Medications   Prior to Admission medications   Medication Sig Start Date End Date Taking? Authorizing Provider  donepezil (ARICEPT) 10 MG tablet Take 10 mg by mouth daily.   Yes Historical Provider, MD  lisinopril-hydrochlorothiazide (PRINZIDE,ZESTORETIC) 20-12.5 MG per tablet TAKE 1 TABLET BY MOUTH EVERY DAY 04/03/14  Yes Corky Crafts, MD  timolol (TIMOPTIC) 0.5 % ophthalmic solution Place 1 drop into both eyes daily.  10/31/14  Yes Historical Provider, MD   BP 121/91 mmHg  Pulse 62  Temp(Src) 97.7 F (36.5 C) (Oral)  Resp 11  SpO2 100% Physical Exam  Constitutional: He appears well-developed and well-nourished. No distress.  Nontoxic appearing.  HENT:  Head: Normocephalic and atraumatic.  Right Ear: External ear normal.  Left Ear: External ear normal.  Mouth/Throat: Oropharynx is clear and moist. No oropharyngeal exudate.  No signs of head injury.   Eyes: Conjunctivae are normal. Pupils are equal, round, and reactive to light. Right eye exhibits no  discharge. Left eye exhibits no discharge.  Neck: Normal range of motion. Neck supple. No JVD present. No tracheal deviation present.  Cardiovascular: Normal rate, regular rhythm, normal heart sounds and intact distal pulses.  Exam reveals no gallop and no friction rub.   No  murmur heard. Bilateral radial, posterior tibialis and dorsalis pedis pulses are intact.    Pulmonary/Chest: Effort normal and breath sounds normal. No respiratory distress. He has no wheezes. He has no rales.  Abdominal: Soft. Bowel sounds are normal. He exhibits no distension and no mass. There is tenderness. There is no rebound and no guarding.  Mild bilateral lower abdominal tenderness. No rebound tenderness. No masses. Abdomen is soft and bowel sounds are present.   Musculoskeletal: He exhibits no edema.  Lymphadenopathy:    He has no cervical adenopathy.  Neurological: He is alert. Coordination normal.  Patient oriented to person and place only. He is alert and answering questions appropriately.   Skin: Skin is warm and dry. No rash noted. He is not diaphoretic. No erythema. No pallor.  Psychiatric: He has a normal mood and affect. His behavior is normal.  Nursing note and vitals reviewed.   ED Course  Procedures (including critical care time) Labs Review Labs Reviewed  COMPREHENSIVE METABOLIC PANEL - Abnormal; Notable for the following:    Glucose, Bld 107 (*)    BUN 27 (*)    Creatinine, Ser 1.93 (*)    GFR calc non Af Amer 31 (*)    GFR calc Af Amer 36 (*)    All other components within normal limits  URINALYSIS, ROUTINE W REFLEX MICROSCOPIC (NOT AT [facility]) - Abnormal; Notable for the following:    Ketones, ur 15 (*)    All other components within normal limits  LIPASE, BLOOD  CBC WITH DIFFERENTIAL/PLATELET    Imaging Review Ct Head Wo Contrast  04/10/2015   CLINICAL DATA:  Acute onset of vomiting. Lying on floor. Altered mental status. Initial encounter.  EXAM: CT HEAD WITHOUT CONTRAST  TECHNIQUE: Contiguous axial images were obtained from the base of the skull through the vertex without intravenous contrast.  COMPARISON:  CT of the head performed 05/12/2014  FINDINGS: There is no evidence of acute infarction, mass lesion, or intra- or extra-axial hemorrhage on CT.   Prominence of the ventricles and sulci reflects mild cortical volume loss. Scattered periventricular and subcortical white matter change reflects small vessel ischemic microangiopathy. Chronic lacunar infarcts are noted at the basal ganglia bilaterally.  The brainstem and fourth ventricle are within normal limits. The cerebral hemispheres demonstrate grossly normal gray-white differentiation. No mass effect or midline shift is seen.  There is no evidence of fracture; visualized osseous structures are unremarkable in appearance. The orbits are within normal limits. Mild mucosal thickening is noted at the frontal sinuses bilaterally. The remaining paranasal sinuses and mastoid air cells are well-aerated. No significant soft tissue abnormalities are seen.  IMPRESSION: 1. No acute intracranial pathology seen on CT. 2. Mild cortical volume loss and scattered small vessel ischemic microangiopathy. Chronic lacunar infarcts at the basal ganglia bilaterally. 3. Mild mucosal thickening at the frontal sinuses bilaterally.   Electronically Signed   By: Roanna Raider M.D.   On: 04/10/2015 00:42     EKG Interpretation   Date/Time:  Tuesday Apr 09 2015 20:24:25 EDT Ventricular Rate:  66 PR Interval:  198 QRS Duration: 77 QT Interval:  416 QTC Calculation: 436 R Axis:   87 Text Interpretation:  Sinus rhythm Ventricular premature complex  Borderline right axis deviation Baseline wander in lead(s) II III aVF No  significant change since last tracing Confirmed by [facility]LL[facility]  MD,  CHRISTOPHER [phone]) on 04/09/2015 9:32:52 PM      Filed Vitals:   04/09/15 2245 04/09/15 2300 04/09/15 2315 04/10/15 0015  BP: 138/79 142/76 119/104 121/91  Pulse:    62  Temp:      TempSrc:      Resp: 17 15 22 11   SpO2:    100%     MDM   Meds given in ED:  Medications  iohexol (OMNIPAQUE) 300 MG/ML solution 25 mL (25 mLs Oral Contrast Given 04/09/15 2123)  sodium chloride 0.9 % bolus 1,000 mL (1,000 mLs Intravenous New Bag/Given  04/09/15 2329)    New Prescriptions   No medications on file    Final diagnoses:  Abdominal pain, lower   This is 81 year old male who presented to the emergency department by EMS complaining of lower abdominal pain and vomiting starting today. He reports vomiting twice today. The patient has a history of dementia and is accompanied by his ex-wife. His ex-wife reports that she found him laying next to his toilet. It is not clear if he fell although there is no evidence of trauma noted. Patient has any chest pain or shortness of breath. On exam patient is afebrile and nontoxic appearing. His abdomen has mild lower abdominal tenderness to palpation. No peritoneal signs. We'll obtain blood work, head CT and CT of his abdomen and pelvis. CBC is unremarkable. CMP shows an elevated creatinine of 1.93. Due to his elevated creatinine will not be able to obtain contrast CT scan. Lipase is within normal limits. Urinalysis is negative for infection. At reevaluation the patient reports feeling much better and back to normal. He denies any abdominal pain or nausea. He has not vomited since arrival to the ED. Awaiting CT abdomen and pelvis to be read by radiology.  CT head shows no acute intracranial pathology. CT abdomen and pelvis shows no acute abnormality in the abdomen or pelvis. The patient is symptom-free prior to discharge and reports being ready for discharge. I advised patient he needs to follow-up with his primary care provider this week to have his creatinine rechecked. The patient and his ex-wife verbalized understanding and agreement. I advised the patient to follow-up with their primary care provider this week. I advised the patient to return to the emergency department with new or worsening symptoms or new concerns. The patient and his wife verbalized understanding and agreement with plan.    This patient was discussed with and evaluated by Dr. Blinda LeatherwoodPollina who agrees with assessment and plan.   Everlene FarrierWilliam  Orra Nolde, PA-C 04/10/15 0150  Gilda Creasehristopher J Pollina, MD 04/13/15 [phone]

## 2015-04-09 NOTE — ED Notes (Signed)
Pt arrives via EMS from home. Pts ex wife called EMS c/o pt having abd pain. Upon EMS arrival pt had vomited and was lying on floor and refusing to speak to EMS. Pts wife states that pt has early stage dementia and his baseline is uncooperative. Pts vital signs were stable with EMS. Pt admitted to feeling "sick."

## 2015-04-10 ENCOUNTER — Emergency Department (HOSPITAL_COMMUNITY): Payer: Medicare HMO

## 2015-04-10 ENCOUNTER — Encounter (HOSPITAL_COMMUNITY): Payer: Self-pay | Admitting: Emergency Medicine

## 2015-04-10 ENCOUNTER — Inpatient Hospital Stay (HOSPITAL_COMMUNITY)
Admission: EM | Admit: 2015-04-10 | Discharge: 2015-04-13 | DRG: 101 | Disposition: A | Discharge status: Skilled Nursing Facility | Payer: Medicare HMO | Attending: Internal Medicine | Admitting: Internal Medicine

## 2015-04-10 DIAGNOSIS — D696 Thrombocytopenia, unspecified: Secondary | ICD-10-CM | POA: Diagnosis present

## 2015-04-10 DIAGNOSIS — I495 Sick sinus syndrome: Secondary | ICD-10-CM | POA: Diagnosis not present

## 2015-04-10 DIAGNOSIS — F0390 Unspecified dementia without behavioral disturbance: Secondary | ICD-10-CM | POA: Diagnosis present

## 2015-04-10 DIAGNOSIS — I6523 Occlusion and stenosis of bilateral carotid arteries: Secondary | ICD-10-CM | POA: Diagnosis present

## 2015-04-10 DIAGNOSIS — N179 Acute kidney failure, unspecified: Secondary | ICD-10-CM | POA: Diagnosis present

## 2015-04-10 DIAGNOSIS — I4891 Unspecified atrial fibrillation: Secondary | ICD-10-CM

## 2015-04-10 DIAGNOSIS — Z9114 Patient's other noncompliance with medication regimen: Secondary | ICD-10-CM | POA: Diagnosis present

## 2015-04-10 DIAGNOSIS — R55 Syncope and collapse: Secondary | ICD-10-CM | POA: Diagnosis present

## 2015-04-10 DIAGNOSIS — E785 Hyperlipidemia, unspecified: Secondary | ICD-10-CM | POA: Diagnosis present

## 2015-04-10 DIAGNOSIS — R404 Transient alteration of awareness: Secondary | ICD-10-CM

## 2015-04-10 DIAGNOSIS — N183 Chronic kidney disease, stage 3 unspecified: Secondary | ICD-10-CM | POA: Diagnosis present

## 2015-04-10 DIAGNOSIS — Z8673 Personal history of transient ischemic attack (TIA), and cerebral infarction without residual deficits: Secondary | ICD-10-CM | POA: Diagnosis not present

## 2015-04-10 DIAGNOSIS — I129 Hypertensive chronic kidney disease with stage 1 through stage 4 chronic kidney disease, or unspecified chronic kidney disease: Secondary | ICD-10-CM | POA: Diagnosis present

## 2015-04-10 DIAGNOSIS — H5442 Blindness, left eye, normal vision right eye: Secondary | ICD-10-CM | POA: Diagnosis present

## 2015-04-10 DIAGNOSIS — G9340 Encephalopathy, unspecified: Secondary | ICD-10-CM | POA: Diagnosis present

## 2015-04-10 DIAGNOSIS — I10 Essential (primary) hypertension: Secondary | ICD-10-CM | POA: Diagnosis present

## 2015-04-10 DIAGNOSIS — I480 Paroxysmal atrial fibrillation: Secondary | ICD-10-CM | POA: Diagnosis present

## 2015-04-10 DIAGNOSIS — H5462 Unqualified visual loss, left eye, normal vision right eye: Secondary | ICD-10-CM | POA: Diagnosis not present

## 2015-04-10 DIAGNOSIS — Z950 Presence of cardiac pacemaker: Secondary | ICD-10-CM | POA: Diagnosis not present

## 2015-04-10 DIAGNOSIS — R569 Unspecified convulsions: Secondary | ICD-10-CM | POA: Diagnosis present

## 2015-04-10 DIAGNOSIS — I358 Other nonrheumatic aortic valve disorders: Secondary | ICD-10-CM | POA: Diagnosis present

## 2015-04-10 HISTORY — DX: Headache, unspecified: R51.9

## 2015-04-10 HISTORY — DX: Chronic kidney disease, stage 3 (moderate): N18.3

## 2015-04-10 HISTORY — DX: Presence of cardiac pacemaker: Z95.0

## 2015-04-10 HISTORY — DX: Headache: R51

## 2015-04-10 HISTORY — DX: Chronic kidney disease, stage 3 unspecified: N18.30

## 2015-04-10 LAB — COMPREHENSIVE METABOLIC PANEL
ALBUMIN: 4.1 g/dL (ref 3.5–5.0)
ALT: 22 U/L (ref 17–63)
AST: 28 U/L (ref 15–41)
Alkaline Phosphatase: 58 U/L (ref 38–126)
Anion gap: 10 (ref 5–15)
BILIRUBIN TOTAL: 0.4 mg/dL (ref 0.3–1.2)
BUN: 21 mg/dL — ABNORMAL HIGH (ref 6–20)
CO2: 26 mmol/L (ref 22–32)
Calcium: 9.3 mg/dL (ref 8.9–10.3)
Chloride: 101 mmol/L (ref 101–111)
Creatinine, Ser: 1.64 mg/dL — ABNORMAL HIGH (ref 0.61–1.24)
GFR calc Af Amer: 44 mL/min — ABNORMAL LOW (ref >60)
GFR, EST NON AFRICAN AMERICAN: 38 mL/min — AB (ref >60)
Glucose, Bld: 157 mg/dL — ABNORMAL HIGH (ref 65–99)
Potassium: 4.3 mmol/L (ref 3.5–5.1)
Sodium: 137 mmol/L (ref 135–145)
Total Protein: 7.1 g/dL (ref 6.5–8.1)

## 2015-04-10 LAB — CBC WITH DIFFERENTIAL/PLATELET
BASOS ABS: 0 10*3/uL (ref 0.0–0.1)
BASOS ABS: 0 10*3/uL (ref 0.0–0.1)
BASOS PCT: 0 % (ref 0–1)
BASOS PCT: 0 % (ref 0–1)
EOS ABS: 0.2 10*3/uL (ref 0.0–0.7)
EOS PCT: 3 % (ref 0–5)
Eosinophils Absolute: 0.1 10*3/uL (ref 0.0–0.7)
Eosinophils Relative: 1 % (ref 0–5)
HCT: 42.7 % (ref 39.0–52.0)
HEMATOCRIT: 41.1 % (ref 39.0–52.0)
Hemoglobin: 14.2 g/dL (ref 13.0–17.0)
Hemoglobin: 14.6 g/dL (ref 13.0–17.0)
LYMPHS PCT: 17 % (ref 12–46)
LYMPHS PCT: 21 % (ref 12–46)
Lymphs Abs: 1.4 10*3/uL (ref 0.7–4.0)
Lymphs Abs: 1.5 10*3/uL (ref 0.7–4.0)
MCH: 30 pg (ref 26.0–34.0)
MCH: 30 pg (ref 26.0–34.0)
MCHC: 34.2 g/dL (ref 30.0–36.0)
MCHC: 34.5 g/dL (ref 30.0–36.0)
MCV: 86.9 fL (ref 78.0–100.0)
MCV: 87.7 fL (ref 78.0–100.0)
Monocytes Absolute: 0.4 10*3/uL (ref 0.1–1.0)
Monocytes Absolute: 0.5 10*3/uL (ref 0.1–1.0)
Monocytes Relative: 5 % (ref 3–12)
Monocytes Relative: 7 % (ref 3–12)
NEUTROS ABS: 4.7 10*3/uL (ref 1.7–7.7)
NEUTROS ABS: 6.4 10*3/uL (ref 1.7–7.7)
Neutrophils Relative %: 77 % (ref 43–77)
Neutrophils Relative %: 69 % (ref 43–77)
Platelets: 122 10*3/uL — ABNORMAL LOW (ref 150–400)
Platelets: UNDETERMINED 10*3/uL (ref 150–400)
RBC: 4.73 MIL/uL (ref 4.22–5.81)
RBC: 4.87 MIL/uL (ref 4.22–5.81)
RDW: 13.5 % (ref 11.5–15.5)
RDW: 13.6 % (ref 11.5–15.5)
WBC: 6.9 10*3/uL (ref 4.0–10.5)
WBC: 8.3 10*3/uL (ref 4.0–10.5)

## 2015-04-10 LAB — URINALYSIS, ROUTINE W REFLEX MICROSCOPIC
BILIRUBIN URINE: NEGATIVE
GLUCOSE, UA: NEGATIVE mg/dL
HGB URINE DIPSTICK: NEGATIVE
KETONES UR: NEGATIVE mg/dL
Leukocytes, UA: NEGATIVE
NITRITE: NEGATIVE
PH: 6.5 (ref 5.0–8.0)
PROTEIN: NEGATIVE mg/dL
Specific Gravity, Urine: 1.016 (ref 1.005–1.030)
UROBILINOGEN UA: 2 mg/dL — AB (ref 0.0–1.0)

## 2015-04-10 LAB — I-STAT CHEM 8, ED
BUN: 24 mg/dL — ABNORMAL HIGH (ref 6–20)
CALCIUM ION: 1.14 mmol/L (ref 1.13–1.30)
Chloride: 100 mmol/L — ABNORMAL LOW (ref 101–111)
Creatinine, Ser: 1.6 mg/dL — ABNORMAL HIGH (ref 0.61–1.24)
Glucose, Bld: 156 mg/dL — ABNORMAL HIGH (ref 65–99)
HCT: 45 % (ref 39.0–52.0)
Hemoglobin: 15.3 g/dL (ref 13.0–17.0)
POTASSIUM: 4.3 mmol/L (ref 3.5–5.1)
Sodium: 138 mmol/L (ref 135–145)
TCO2: 24 mmol/L (ref 0–100)

## 2015-04-10 LAB — I-STAT TROPONIN, ED: Troponin i, poc: 0.01 ng/mL (ref 0.00–0.08)

## 2015-04-10 LAB — I-STAT CG4 LACTIC ACID, ED: Lactic Acid, Venous: 1.59 mmol/L (ref 0.5–2.0)

## 2015-04-10 LAB — CBG MONITORING, ED: GLUCOSE-CAPILLARY: 128 mg/dL — AB (ref 65–99)

## 2015-04-10 MED ORDER — SODIUM CHLORIDE 0.9 % IV SOLN
1000.0000 mg | Freq: Once | INTRAVENOUS | Status: AC
  Administered 2015-04-10: 1000 mg via INTRAVENOUS
  Filled 2015-04-10: qty 10

## 2015-04-10 NOTE — Discharge Instructions (Signed)
Nausea and Vomiting  Nausea is a sick feeling that often comes before throwing up (vomiting). Vomiting is a reflex where stomach contents come out of your mouth. Vomiting can cause severe loss of body fluids (dehydration). Children and elderly adults can become dehydrated quickly, especially if they also have diarrhea. Nausea and vomiting are symptoms of a condition or disease. It is important to find the cause of your symptoms.  CAUSES    Direct irritation of the stomach lining. This irritation can result from increased acid production (gastroesophageal reflux disease), infection, food poisoning, taking certain medicines (such as nonsteroidal anti-inflammatory drugs), alcohol use, or tobacco use.   Signals from the brain.These signals could be caused by a headache, heat exposure, an inner ear disturbance, increased pressure in the brain from injury, infection, a tumor, or a concussion, pain, emotional stimulus, or metabolic problems.   An obstruction in the gastrointestinal tract (bowel obstruction).   Illnesses such as diabetes, hepatitis, gallbladder problems, appendicitis, kidney problems, cancer, sepsis, atypical symptoms of a heart attack, or eating disorders.   Medical treatments such as chemotherapy and radiation.   Receiving medicine that makes you sleep (general anesthetic) during surgery.  DIAGNOSIS  Your caregiver may ask for tests to be done if the problems do not improve after a few days. Tests may also be done if symptoms are severe or if the reason for the nausea and vomiting is not clear. Tests may include:   Urine tests.   Blood tests.   Stool tests.   Cultures (to look for evidence of infection).   X-rays or other imaging studies.  Test results can help your caregiver make decisions about treatment or the need for additional tests.  TREATMENT  You need to stay well hydrated. Drink frequently but in small amounts.You may wish to drink water, sports drinks, clear broth, or eat frozen  ice pops or gelatin dessert to help stay hydrated.When you eat, eating slowly may help prevent nausea.There are also some antinausea medicines that may help prevent nausea.  HOME CARE INSTRUCTIONS    Take all medicine as directed by your caregiver.   If you do not have an appetite, do not force yourself to eat. However, you must continue to drink fluids.   If you have an appetite, eat a normal diet unless your caregiver tells you differently.   Eat a variety of complex carbohydrates (rice, wheat, potatoes, bread), lean meats, yogurt, fruits, and vegetables.   Avoid high-fat foods because they are more difficult to digest.   Drink enough water and fluids to keep your urine clear or pale yellow.   If you are dehydrated, ask your caregiver for specific rehydration instructions. Signs of dehydration may include:   Severe thirst.   Dry lips and mouth.   Dizziness.   Dark urine.   Decreasing urine frequency and amount.   Confusion.   Rapid breathing or pulse.  SEEK IMMEDIATE MEDICAL CARE IF:    You have blood or Clymer flecks (like coffee grounds) in your vomit.   You have black or bloody stools.   You have a severe headache or stiff neck.   You are confused.   You have severe abdominal pain.   You have chest pain or trouble breathing.   You do not urinate at least once every 8 hours.   You develop cold or clammy skin.   You continue to vomit for longer than 24 to 48 hours.   You have a fever.  MAKE SURE YOU:      Understand these instructions.   Will watch your condition.   Will get help right away if you are not doing well or get worse.  Document Released: 10/26/2005 Document Revised: 01/18/2012 Document Reviewed: 03/25/2011  [employer] Patient Information 2015 [employer]. This information is not intended to replace advice given to you by your health care provider. Make sure you discuss any questions you have with your health care provider.      Abdominal Pain  Many things can cause  abdominal pain. Usually, abdominal pain is not caused by a disease and will improve without treatment. It can often be observed and treated at home. Your health care provider will do a physical exam and possibly order blood tests and X-rays to help determine the seriousness of your pain. However, in many cases, more time must pass before a clear cause of the pain can be found. Before that point, your health care provider may not know if you need more testing or further treatment.  HOME CARE INSTRUCTIONS   Monitor your abdominal pain for any changes. The following actions may help to alleviate any discomfort you are experiencing:   Only take over-the-counter or prescription medicines as directed by your health care provider.   Do not take laxatives unless directed to do so by your health care provider.   Try a clear liquid diet (broth, tea, or water) as directed by your health care provider. Slowly move to a bland diet as tolerated.  SEEK MEDICAL CARE IF:   You have unexplained abdominal pain.   You have abdominal pain associated with nausea or diarrhea.   You have pain when you urinate or have a bowel movement.   You experience abdominal pain that wakes you in the night.   You have abdominal pain that is worsened or improved by eating food.   You have abdominal pain that is worsened with eating fatty foods.   You have a fever.  SEEK IMMEDIATE MEDICAL CARE IF:    Your pain does not go away within 2 hours.   You keep throwing up (vomiting).   Your pain is felt only in portions of the abdomen, such as the right side or the left lower portion of the abdomen.   You pass bloody or black tarry stools.  MAKE SURE YOU:   Understand these instructions.    Will watch your condition.    Will get help right away if you are not doing well or get worse.   Document Released: 08/05/2005 Document Revised: 10/31/2013 Document Reviewed: 07/05/2013  [employer] Patient Information 2015 [employer]. This information  is not intended to replace advice given to you by your health care provider. Make sure you discuss any questions you have with your health care provider.

## 2015-04-10 NOTE — ED Provider Notes (Signed)
CSN: [id number]     Arrival date & time 04/10/15  2137 History   First MD Initiated Contact with Patient 04/10/15 2140     Chief Complaint  Patient presents with  . Altered Mental Status     (Consider location/radiation/quality/duration/timing/severity/associated sxs/prior Treatment) Patient is a 81 y.o. male presenting with syncope. The history is provided by the EMS personnel. The history is limited by the condition of the patient.  Loss of Consciousness Episode history:  Single Most recent episode:  Today Duration: unknown. Timing:  Constant Progression:  Unchanged Chronicity:  New Context comment:  Found down by wife on stairs Witnessed: no   Relieved by:  Nothing Worsened by:  Nothing tried Ineffective treatments:  None tried Associated symptoms comment:  Unable to obtain ROS due to AMS Risk factors comment:  Sinus node dysfunction s/p pacemaker placement   Past Medical History  Diagnosis Date  . Hypertension   . Sinus node dysfunction     a. s/p MDT dual chamber pacemaker implanted by Dr Amil Amen  . CKD (chronic kidney disease), stage III   . Pacemaker   . Headache    Past Surgical History  Procedure Laterality Date  . Pacemaker insertion  2007    MDT dual chamber pacemaker implanted for sinus node dysfunction by Dr Ty Hilts  . Cardiac catheterization  2007    normal coronaries   Family History  Problem Relation Age of Onset  . Cancer Father    History  Substance Use Topics  . Smoking status: Never Smoker   . Smokeless tobacco: Never Used  . Alcohol Use: No    Review of Systems  Unable to perform ROS: Mental status change  Cardiovascular: Positive for syncope.      Allergies  Review of patient's allergies indicates no known allergies.  Home Medications   Prior to Admission medications   Medication Sig Start Date End Date Taking? Authorizing Provider  acetaminophen (TYLENOL) 500 MG tablet Take 500 mg by mouth every 6 (six) hours as needed for mild  pain or moderate pain.   Yes Historical Provider, MD  donepezil (ARICEPT) 10 MG tablet Take 10 mg by mouth daily.   Yes Historical Provider, MD  lisinopril-hydrochlorothiazide (PRINZIDE,ZESTORETIC) 20-12.5 MG per tablet TAKE 1 TABLET BY MOUTH EVERY DAY 04/03/14  Yes Corky Crafts, MD  timolol (TIMOPTIC) 0.5 % ophthalmic solution Place 1 drop into both eyes daily.  10/31/14  Yes Historical Provider, MD   BP 147/77 mmHg  Pulse 74  Temp(Src) 98.3 F (36.8 C) (Oral)  Resp 20  Ht  (1.727 m)  Wt 175 lb 6.4 oz (79.561 kg)  BMI 26.68 kg/m2  SpO2 99% Physical Exam  Constitutional: He appears well-developed and well-nourished. No distress.  HENT:  Head: Normocephalic and atraumatic.  Mouth/Throat: Oropharynx is clear and moist.  Eyes: EOM are normal. Pupils are equal, round, and reactive to light.  Neck:  c-collar in place  Cardiovascular: Normal rate and intact distal pulses.  An irregularly irregular rhythm present. Exam reveals no gallop and no friction rub.   No murmur heard. Pulmonary/Chest: Breath sounds normal. No respiratory distress. He has no wheezes. He has no rales.  Irregular respirations with GCS 8 initially- improved shortly after with GCS 11, spontaneous respirations without respiratory distress  Abdominal: Soft. Bowel sounds are normal. He exhibits no distension. There is no tenderness. There is no rebound and no guarding.  Musculoskeletal: He exhibits no edema or tenderness.  Neurological: He has normal strength. He is  unresponsive. No cranial nerve deficit or sensory deficit. He exhibits normal muscle tone. GCS eye subscore is 2. GCS verbal subscore is 1. GCS motor subscore is 5.  Pt initially unresponsive with GCS 8, however mental status subsequently improved- pt able to nod head in answer to questions and follow commands, GCS improved to 11  Skin: Skin is warm. No rash noted. He is not diaphoretic. No erythema. No pallor.  Nursing note and vitals reviewed.   ED  Course  Procedures (including critical care time) Labs Review Labs Reviewed  URINALYSIS, ROUTINE W REFLEX MICROSCOPIC (NOT AT [facility]) - Abnormal; Notable for the following:    Urobilinogen, UA 2.0 (*)    All other components within normal limits  COMPREHENSIVE METABOLIC PANEL - Abnormal; Notable for the following:    Glucose, Bld 157 (*)    BUN 21 (*)    Creatinine, Ser 1.64 (*)    GFR calc non Af Amer 38 (*)    GFR calc Af Amer 44 (*)    All other components within normal limits  CBC - Abnormal; Notable for the following:    Platelets 108 (*)    All other components within normal limits  BASIC METABOLIC PANEL - Abnormal; Notable for the following:    Creatinine, Ser 1.32 (*)    GFR calc non Af Amer 50 (*)    GFR calc Af Amer 58 (*)    All other components within normal limits  I-STAT CHEM 8, ED - Abnormal; Notable for the following:    Chloride 100 (*)    BUN 24 (*)    Creatinine, Ser 1.60 (*)    Glucose, Bld 156 (*)    All other components within normal limits  CBG MONITORING, ED - Abnormal; Notable for the following:    Glucose-Capillary 128 (*)    All other components within normal limits  CBG MONITORING, ED - Abnormal; Notable for the following:    Glucose-Capillary 102 (*)    All other components within normal limits  URINE CULTURE  MRSA PCR SCREENING  CBC WITH DIFFERENTIAL/PLATELET  URINE RAPID DRUG SCREEN (HOSP PERFORMED) NOT AT [facility]  TSH  T4, FREE  T3, FREE  TROPONIN I  TROPONIN I  TROPONIN I  PROTIME-INR  GLUCOSE, CAPILLARY  BASIC METABOLIC PANEL  LIPID PANEL  HEMOGLOBIN A1C  SEDIMENTATION RATE  C-REACTIVE PROTEIN  I-STAT CG4 LACTIC ACID, ED  I-STAT TROPOININ, ED    Imaging Review No results found.   EKG Interpretation   Date/Time:  Wednesday April 10 2015 21:40:04 EDT Ventricular Rate:  91 PR Interval:    QRS Duration: 75 QT Interval:  374 QTC Calculation: 460 R Axis:   78 Text Interpretation:  Atrial fibrillation Borderline T abnormalities,   diffuse leads pvc seen  Abnormal ekg Since last tracing Normal sinus  rhythm replaced wtih afib Confirmed by Hyacinth Meeker  MD, BRIAN ([id number]) on  04/10/2015 9:57:58 PM      MDM   Final diagnoses:  Acute encephalopathy  Atrial fibrillation, unspecified    81 yo M with PMH of sinus node dysfunction s/p pacemaker placement, HTN, dementia, presenting after being found unresponsive on stairs by wife.  EMS reports that on their arrival pt unresponsive with GCS 8 but pulses present.  Assisted respirations with BVM.  Pacemaker noted to not be pacing, pt in irregularly irregular rhythm en route with stable BP.  Unknown if pt sustained fall.  Per chart review pt has [employer]tr[employer] pacemaker- per note from 3/16, has 14  months battery life remaining.  Per chart review pt also noted to be non-compliant with medications and device clinic f/u.  Also has questionable seizure history, discussion of starting Keppra in the past although pt does not appear to be taking this currently.  Pt also had ED visit yesterday for abdominal pain- per documentation was found lying on ground in bathroom but wife did not believe he fell at that time.  He had normal CT head and abdomen yesterday.  Also noted to be in NSR on EKG yesterday.  On presentation, pt unresponsive with GCS 8- preparations made to intubate, however during this process mental status improved- pt able to nod head in answer to questions and follow commands, GCS improved to 11.  Shakes head no in answer regarding wishes for intubation or CPR.  Is currently protecting airway.  EKG shows irregularly irregular rhythm which is most consistent with Afib, intermittently paced, rate controlled. No acute ischemic changes.  Pacemaker interrogated- tachyarrhythmia noted.  Possible syncope vs seizure.  Plan for CT head and c-spine given unknown fall with pt found on stairs; CXR, labs.    Imaging results WNL.  Labs without acute abnormalities. Concern for seizure as pt may have been  post-ictal on arrival given improvement in mental status.  He is now alert and speaking.  Neuro consulted- agree with starting Keppra and they will evaluate.  Pt admitted to Medicine for further workup with Neuro following.  No other acute events during my care.  Discussed with attending Dr. Hyacinth MeekerMiller.     Jodean LimaEmily Liesa Tsan, MD 04/12/15 2256  Eber HongBrian Miller, MD 04/13/15 [phone]

## 2015-04-10 NOTE — ED Provider Notes (Signed)
The patient is a 81 year old male, according to the paramedics the patient was found unresponsive at the bottom of the stairs on a stairwell at his apartment complex. He was completely unresponsive with a GCS of 8, his pacemaker was not firing that he could have an underlying atrial fibrillation rhythm, he was hypoxic with inadequate respiratory effort, he had pulses, blood pressure, was not following commands. He required bag-valve-mask ventilation in route to the hospital, no medications were given, his mental status did not change prior to arrival. He was given supplemental oxygen for hypoxia. Review of the medical record shows that the patient does have an indwelling pacemaker which was placed for sinus node dysfunction, it is a Medtronic pacemaker, it was last evaluated in March and had about 14 months of battery life left at that time, he also has a history of noncompliance with medications and follow up with cardiologist. The patient is unable to give any history secondary to altered mental status.  On exam the patient appears critically ill, his respirations are infrequent, irregular, initially the patient did not follow any commands, did not respond to painful stimuli, preparations were made to intubate the patient however he eventually started to respond, opened his eyes, was able to shake his head no when asked if he wanted to be intubated if needed or if he was to have cardiac resuscitation if needed. The patient is unable to voice any words, he does appear to understand my questions with is nods.  Lungs are clear, heart is irregular in an underlying atrial fibrillation, on the cardiac monitor to the patient has intermittent paced rhythm, requiring oxygen supplementation, we are unsure if the patient had a fall, he has been immobilized in a cervical collar prior to arrival by paramedics, he will need repeat imaging, this is a potential seizure, potential cardiac event, he will obviously need admission  to the hospital for further evaluation after imaging cardiac monitoring and pacemaker interrogation. At this time the patient is improving and no longer needs definitive airway management as his GCS is closer to 11 right now  I saw and evaluated the patient, reviewed the resident's note and I agree with the findings and plan.  I personally interpreted the EKG as well as the resident and agree with the interpretation on the resident's chart.   EKG Interpretation  Date/Time:  Wednesday April 10 2015 21:40:04 EDT Ventricular Rate:  91 PR Interval:    QRS Duration: 75 QT Interval:  374 QTC Calculation: 460 R Axis:   78 Text Interpretation:  Atrial fibrillation Borderline T abnormalities, diffuse leads pvc seen  Abnormal ekg Since last tracing Normal sinus rhythm replaced wtih afib Confirmed by Hyacinth MeekerMILLER  MD, Ayza Ripoll ([id number]) on 04/10/2015 9:57:58 PM      CRITICAL CARE Performed by: Eber HongMILLER,Anjel Pardo D Total critical care time: 35 Critical care time was exclusive of separately billable procedures and treating other patients. Critical care was necessary to treat or prevent imminent or life-threatening deterioration. Critical care was time spent personally by me on the following activities: development of treatment plan with patient and/or surrogate as well as nursing, discussions with consultants, evaluation of patient's response to treatment, examination of patient, obtaining history from patient or surrogate, ordering and performing treatments and interventions, ordering and review of laboratory studies, ordering and review of radiographic studies, pulse oximetry and re-evaluation of patient's condition.   Final diagnoses:  Acute encephalopathy  Atrial fibrillation, unspecified        Eber HongBrian Finnleigh Marchetti, MD 04/13/15 [phone]

## 2015-04-10 NOTE — ED Provider Notes (Signed)
Patient presented to the ER with abdominal pain. Patient was complaining of abdominal pain earlier tonight. Family reports they found him lying on the floor in the bathroom. It's not clear if he fell, although there is no bruising noted. He is not expressing any chest pain or shortness of breath.  Face to face Exam: HEENT - PERRLA Lungs - CTAB Heart - RRR, no M/R/G Abd - S/NT/ND Neuro - alert, oriented x3  Plan: Seen and evaluated. He shows evidence of mild acute kidney injury, otherwise lab work was unremarkable. CT head and CT abdomen and pelvis performed. Follow-up results, if negative, anticipate discharge and follow-up with primary doctor for repeat BUN and creatinine.  Kathelene Rumberger Gilda CreaseJ Ghali Morissette, MD 04/10/15 [phone]

## 2015-04-10 NOTE — H&P (Signed)
[facility] History and Physical  Wesley Cook RUE:[id number] DOB: 12-26-1933 DOA: 04/10/2015  Referring physician: ED physician PCP: Wesley Pitter, MD  Specialists:   Chief Complaint: AMS  HPI: Wesley Cook is a 81 y.o. male with PMH of hypertension, sinus node dysfunction, pacemaker placement, who presents with altered mental status.  Patient was found by a neighbor in the bottom of the stairs unconscious tonight. There had been some vomiting with this episode. He was transported to the ED where he was initially unresponsive, but subsequently regained consciousness. His mental status has gradually improved. When I saw patient in the emergency room, he was mildly confused, but oriented to place and person (not to time). He could answer some questions appropriately. He denies any pain. Per his wife, at baseline, he has been suffering from dementia. His wife lives in an independent living facility and he lives alone in an apartment. Of note, he presented with an similar episode yesterday where he was found outside of his bathroom unresponsive and was amnestic to the episode.   Currently patient denies fever, chills, fatigue, running nose, ear pain, headaches, cough, chest pain, SOB, abdominal pain, diarrhea, constipation, dysuria, urgency, frequency, hematuria, skin rashes, joint pain or leg swelling. No unilateral weakness, numbness or tingling sensations. No vision change or hearing loss.  In ED, patient was found to have new onset of A. Fib, which converted to sinus regular rhythm when I saw patient in ED. His pacemaker was interrogated. WBC 6.9, temperature normal, negative urinalysis, lactate of 1.59, stable renal function. Negative CT head and C-spine for acute abnormalities. Patient is admitted to inpatient for further evaluation and treatment. Neurology was consulted.  Where does patient live?   At home   Can patient participate in ADLs?  Some   Review of Systems:   General: no  fevers, chills, no changes in body weight, normal appetite, no fatigue HEENT: no blurry vision, hearing changes or sore throat Pulm: no dyspnea, coughing, wheezing CV: no chest pain, palpitations Abd: had vomiting, no abdominal pain, diarrhea, constipation GU: no dysuria, burning on urination, increased urinary frequency, hematuria  Ext: no leg edema Neuro: no unilateral weakness, numbness, or tingling, no vision change or hearing loss Skin: no rash MSK: No muscle spasm, no deformity, no limitation of range of movement in spin Heme: No easy bruising.  Travel history: No recent long distant travel.  Allergy: No Known Allergies  Past Medical History  Diagnosis Date  . Hypertension   . Sinus node dysfunction     a. s/p MDT dual chamber pacemaker implanted by Dr Wesley Cook  . CKD (chronic kidney disease), stage III   . Pacemaker     Past Surgical History  Procedure Laterality Date  . Pacemaker insertion  2007    MDT dual chamber pacemaker implanted for sinus node dysfunction by Dr Wesley Cook  . Cardiac catheterization  2007    normal coronaries    Social History:  reports that he has never smoked. He does not have any smokeless tobacco history on file. He reports that he does not drink alcohol or use illicit drugs.  Family History:  Family History  Problem Relation Age of Onset  . Cancer Father      Prior to Admission medications   Medication Sig Start Date End Date Taking? Authorizing Provider  acetaminophen (TYLENOL) 500 MG tablet Take 500 mg by mouth every 6 (six) hours as needed for mild pain or moderate pain.   Yes Historical Provider, MD  donepezil (ARICEPT)  10 MG tablet Take 10 mg by mouth daily.   Yes Historical Provider, MD  lisinopril-hydrochlorothiazide (PRINZIDE,ZESTORETIC) 20-12.5 MG per tablet TAKE 1 TABLET BY MOUTH EVERY DAY 04/03/14  Yes Wesley Crafts, MD  timolol (TIMOPTIC) 0.5 % ophthalmic solution Place 1 drop into both eyes daily.  10/31/14  Yes Historical  Provider, MD    Physical Exam: Filed Vitals:   04/11/15 0130 04/11/15 0145 04/11/15 0200 04/11/15 0215  BP: 135/72 142/77 136/73 140/74  Pulse: 59 58 60 59  Temp:      TempSrc:      Resp: SpO2: 100% 100% 97% 98%   General: Not in acute distress. Dry mucous and membrane. HEENT:       Eyes: PERRL, EOMI, no scleral icterus.       ENT: No discharge from the ears and nose, no pharynx injection, no tonsillar enlargement.        Neck: No JVD, no bruit, no mass felt. Heme: No neck lymph node enlargement. Cardiac: S1/S2, RRR, No murmurs, No gallops or rubs. Pulm: Good air movement bilaterally. No rales, wheezing, rhonchi or rubs. Abd: Soft, nondistended, nontender, no rebound pain, no organomegaly, BS present. Ext: No pitting leg edema bilaterally. 2+DP/PT pulse bilaterally. Musculoskeletal: No joint deformities, No joint redness or warmth, no limitation of ROM in spin. Skin: No rashes.  Neuro: drowsy, oriented to place and person, but not to time, cranial nerves II-XII grossly intact, muscle strength 5/5 in all extremities, sensation to light touch intact. Brachial reflex 2+ bilaterally. Knee reflex 1+ bilaterally. Negative Babinski's sign. Normal finger to nose test. Psych: Patient is not psychotic, no suicidal or hemocidal ideation.  Labs on Admission:  Basic Metabolic Panel:  Recent Labs Lab 04/09/15 2138 04/10/15 2151 04/10/15 2156  NA 137 138 137  K 4.0 4.3 4.3  CL 101 100* 101  CO2 23  --  26  GLUCOSE 107* 156* 157*  BUN 27* 24* 21*  CREATININE 1.93* 1.60* 1.64*  CALCIUM 9.2  --  9.3   Liver Function Tests:  Recent Labs Lab 04/09/15 2138 04/10/15 2156  AST 26 28  ALT 22 22  ALKPHOS 59 58  BILITOT 0.8 0.4  PROT 7.0 7.1  ALBUMIN 4.0 4.1    Recent Labs Lab 04/09/15 2138  LIPASE 24   No results for input(s): AMMONIA in the last 168 hours. CBC:  Recent Labs Lab 04/10/15 0053 04/10/15 2151 04/10/15 2156  WBC 8.3  --  6.9  NEUTROABS 6.4  --   4.7  HGB 14.2 15.3 14.6  HCT 41.1 45.0 42.7  MCV 86.9  --  87.7  PLT 122*  --  PLATELET CLUMPS NOTED ON SMEAR, UNABLE TO ESTIMATE   Cardiac Enzymes: No results for input(s): CKTOTAL, CKMB, CKMBINDEX, TROPONINI in the last 168 hours.  BNP (last 3 results) No results for input(s): BNP in the last 8760 hours.  ProBNP (last 3 results) No results for input(s): PROBNP in the last 8760 hours.  CBG:  Recent Labs Lab 04/10/15 2141  GLUCAP 128*    Radiological Exams on Admission: Ct Abdomen Pelvis Wo Contrast  04/10/2015   CLINICAL DATA:  Abdominal pain and vomiting.  Lower abdominal pain.  EXAM: CT ABDOMEN AND PELVIS WITHOUT CONTRAST  TECHNIQUE: Multidetector CT imaging of the abdomen and pelvis was performed following the standard protocol without IV contrast.  COMPARISON:  Contrast-enhanced exam 05/12/2014  FINDINGS: Mild motion artifact through the lung bases. There is dependent atelectasis. Cardiac pacer  leads are partially included. The heart size is normal.  Hepatic granulomas are unchanged, no evidence focal hepatic lesion on noncontrast exam. Gallbladder is physiologically distended. There is no biliary dilatation. The unenhanced spleen, pancreas, and adrenal glands are normal.  Extrarenal pelvis configuration of the right kidney, unchanged from prior exam. No hydronephrosis or renal stone. Left kidney is normal. Vascular calcification noted of the right renal hila. No perinephric stranding.  Small hiatal hernia. Stomach is distended with ingested contrast. There are no dilated or thickened bowel loops. Small volume of colonic stool without colonic wall thickening. Mild to moderate distal colonic diverticulosis without diverticulitis. The appendix is normal. No free air, free fluid, or intra-abdominal fluid collection.  No retroperitoneal adenopathy. Abdominal aorta is normal in caliber. Moderate atherosclerosis of the abdominal aorta and its branches.  Within the pelvis the urinary bladder is  physiologically distended. Prostate gland is normal in size. There is no pelvic free fluid or adenopathy.  Multilevel degenerative change throughout the spine with grade 1 anterolisthesis of L4 on L5 that appears degenerative. There are no acute or suspicious osseous abnormalities. Degenerative change of both hips and sacroiliac joints.  IMPRESSION: 1. No acute abnormality in the abdomen/pelvis. 2. Small hiatal hernia. Distal colonic diverticulosis without diverticulitis.   Electronically Signed   By: Rubye Oaks M.D.   On: 04/10/2015 00:56   Ct Head Wo Contrast  04/10/2015   CLINICAL DATA:  Patient found unresponsive. Syncopal episode yesterday with altered mental status.  EXAM: CT HEAD WITHOUT CONTRAST  CT CERVICAL SPINE WITHOUT CONTRAST  TECHNIQUE: Multidetector CT imaging of the head and cervical spine was performed following the standard protocol without intravenous contrast. Multiplanar CT image reconstructions of the cervical spine were also generated.  COMPARISON:  CT of the head on 04/09/2015 and 05/12/2014  FINDINGS: CT HEAD FINDINGS  Stable advanced small vessel ischemic disease in the periventricular white matter and moderate cortical atrophy. The brain demonstrates no evidence of hemorrhage, acute infarction, edema, mass effect, extra-axial fluid collection, hydrocephalus or mass lesion. The skull is unremarkable.  CT CERVICAL SPINE FINDINGS  The cervical spine shows normal alignment. There is no evidence of acute fracture or subluxation. No soft tissue swelling or hematoma is identified. Advanced cervical spondylosis noted at C5-6, C6-7 and C7-T1.  No bony or soft tissue lesions are seen. The visualized airway is normally patent.  IMPRESSION: 1. Stable atrophy and small vessel disease. No acute intracranial findings. 2. No evidence of cervical fracture. Advanced cervical spondylosis present.   Electronically Signed   By: Irish Lack M.D.   On: 04/10/2015 22:43   Ct Head Wo  Contrast  04/10/2015   CLINICAL DATA:  Acute onset of vomiting. Lying on floor. Altered mental status. Initial encounter.  EXAM: CT HEAD WITHOUT CONTRAST  TECHNIQUE: Contiguous axial images were obtained from the base of the skull through the vertex without intravenous contrast.  COMPARISON:  CT of the head performed 05/12/2014  FINDINGS: There is no evidence of acute infarction, mass lesion, or intra- or extra-axial hemorrhage on CT.  Prominence of the ventricles and sulci reflects mild cortical volume loss. Scattered periventricular and subcortical white matter change reflects small vessel ischemic microangiopathy. Chronic lacunar infarcts are noted at the basal ganglia bilaterally.  The brainstem and fourth ventricle are within normal limits. The cerebral hemispheres demonstrate grossly normal gray-white differentiation. No mass effect or midline shift is seen.  There is no evidence of fracture; visualized osseous structures are unremarkable in appearance. The orbits are  within normal limits. Mild mucosal thickening is noted at the frontal sinuses bilaterally. The remaining paranasal sinuses and mastoid air cells are well-aerated. No significant soft tissue abnormalities are seen.  IMPRESSION: 1. No acute intracranial pathology seen on CT. 2. Mild cortical volume loss and scattered small vessel ischemic microangiopathy. Chronic lacunar infarcts at the basal ganglia bilaterally. 3. Mild mucosal thickening at the frontal sinuses bilaterally.   Electronically Signed   By: Roanna Raider M.D.   On: 04/10/2015 00:42   Ct Cervical Spine Wo Contrast  04/10/2015   CLINICAL DATA:  Patient found unresponsive. Syncopal episode yesterday with altered mental status.  EXAM: CT HEAD WITHOUT CONTRAST  CT CERVICAL SPINE WITHOUT CONTRAST  TECHNIQUE: Multidetector CT imaging of the head and cervical spine was performed following the standard protocol without intravenous contrast. Multiplanar CT image reconstructions of the  cervical spine were also generated.  COMPARISON:  CT of the head on 04/09/2015 and 05/12/2014  FINDINGS: CT HEAD FINDINGS  Stable advanced small vessel ischemic disease in the periventricular white matter and moderate cortical atrophy. The brain demonstrates no evidence of hemorrhage, acute infarction, edema, mass effect, extra-axial fluid collection, hydrocephalus or mass lesion. The skull is unremarkable.  CT CERVICAL SPINE FINDINGS  The cervical spine shows normal alignment. There is no evidence of acute fracture or subluxation. No soft tissue swelling or hematoma is identified. Advanced cervical spondylosis noted at C5-6, C6-7 and C7-T1.  No bony or soft tissue lesions are seen. The visualized airway is normally patent.  IMPRESSION: 1. Stable atrophy and small vessel disease. No acute intracranial findings. 2. No evidence of cervical fracture. Advanced cervical spondylosis present.   Electronically Signed   By: Irish Lack M.D.   On: 04/10/2015 22:43   Dg Chest Portable 1 View  04/10/2015   CLINICAL DATA:  Altered mental status and abdominal pain.  EXAM: PORTABLE CHEST - 1 VIEW  COMPARISON:  05/12/2012  FINDINGS: Bibasilar opacities are consistent with atelectasis versus infiltrates. Aspiration pneumonia cannot be excluded. No evidence of pulmonary edema or pleural effusion. No pneumothorax is identified. The heart size is normal. There is stable appearance to a dual-chamber pacemaker.  IMPRESSION: Bibasilar atelectasis versus infiltrates.   Electronically Signed   By: Irish Lack M.D.   On: 04/10/2015 22:17    EKG: Independently reviewed.  Abnormal findings:  New onset A fib.   Assessment/Plan Principal Problem:   Acute encephalopathy Active Problems:   Essential hypertension, benign   Cardiac pacemaker in situ   Sinus node dysfunction   Chronic kidney disease (CKD), stage III (moderate)   New onset a-fib  Acute encephalopathy: Etiology is not clear. no signs of stroke. No signs of  infection. CT head and C-spine negative for acute abnormalities. Neurology was consulted. Dr. Amada Jupiter saw patient, and thought this was most likely due to seizure. Patient's mental status has already improved significantly.  -will admit to sdu -Appreciate Dr. Alene Mires consultation, with follow-up recommendations to start Keppra and get EEG -Frequent neuro check -Nothing by mouth until mental status comes back to baseline -IVF; ns 100cc/h -UDS  Essential hypertension, benign:  -Switch Prinzide to amlodipine since patient is clinically dry and needs IVF. Also he has CKD-III, his cre is at upper normal limit  Chronic kidney disease (CKD), stage III (moderate): Baseline creatinine 1.2-1.6, his creatinine is 1.64, BUN 21, which is stable, but in upper normal limits. -Switch Prinzide to amlodipine as above -IVF as above -Follow-up renal function, BMP  New onset a-fib: likely  due to stress from possible seizure and transient hypoxia secondary to seizure. Patient has history sinus node dysfunction, which may have contributed in acute stress situation. Still need to rule out other possibilities, such as thyroid dysfunction. His A. fib has already converted to sinus regular rhythm when I saw patient in emergency room. Patient does not have any chest pain or palpitation. CHA2DS2-VASc Score is 2, needs oral anticoagulation if continue to have A fib. For now, I will not start anticoagulants since it is likely to be transient.  - trop x 3 - repeat EKG in AM - check tsh, free t4 and t3 - monitor on tele - has pacemaker  DVT ppx: SQ Heparin       Code Status: Full code Family Communication: Yes, patient's wife at bed side Disposition Plan: Admit to inpatient   Date of Service 04/11/2015    Lorretta HarpIU, [place] [facility] Pager [phone]  If 7PM-7AM, please contact night-coverage [email or web address] Password [email or web address] 04/11/2015, 2:31 AM

## 2015-04-10 NOTE — ED Notes (Signed)
Pt was found unresponsive on the bottom of the stairs, EMS reports GCS 8, RR 14. Pt seen yesterday for syncopal episode in the bathroom, tx for dehydration.

## 2015-04-10 NOTE — Consult Note (Signed)
Neurology Consultation Reason for Consult: Episode of unconsciousness Referring Physician: Hyacinth Meeker, B  CC: Unresponsive episode  History is obtained from: Patient  HPI: Wesley Cook is a 81 y.o. male with a history of sinus node dysfunction who presents with episode of unconsciousness. Of note, he presented with an episode yesterday where he was found outside of his bathroom unresponsive and was amnestic to the episode. He had vomited.  Tonight, he was found by a neighbor in the bottom of the stairs unconscious. Apparently there had been some vomiting with this episode as well. He was transported to the ED where he was initially unresponsive, but subsequently regained consciousness but was not speaking and confused, and slowly regained his baseline state.  At baseline, he has been suffering from dementia. His wife lives in an independent living facility and he lives alone in an apartment.  He was tachycardic on presentation with a rhythm resembling atrial fibrillation.  Today, his pacemaker was interrogated and he had the tachyarrhythmia seen here, but it is not clear that he had this yesterday during his episode of loss of consciousness at that time.    ROS: A 14 point ROS was performed and is negative except as noted in the HPI.   Past Medical History  Diagnosis Date  . Hypertension   . Sinus node dysfunction     a. s/p MDT dual chamber pacemaker implanted by Dr Amil Amen    Family History: No history of seizures  Social History: Tob: Denies Denies alcohol  Exam: Current vital signs: BP 144/89 mmHg  Pulse 63  Temp(Src) 97.2 F (36.2 C) (Oral)  Resp 20  SpO2 100% Vital signs in last 24 hours: Temp:  [97.2 F (36.2 C)] 97.2 F (36.2 C) (06/01 2147) Pulse Rate:  [60-86] 63 (06/01 2315) Resp:  [11-30] 20 (06/01 2315) BP: (121-168)/(72-96) 144/89 mmHg (06/01 2315) SpO2:  [99 %-100 %] 100 % (06/01 2315)  Physical Exam  Constitutional: Appears well-developed and  well-nourished.  Psych: Affect appropriate to situation Eyes: No scleral injection HENT: No OP obstrucion Head: Normocephalic.  Cardiovascular: Normal rate and regular rhythm.  Respiratory: Effort normal and breath sounds normal to anterior ascultation GI: Soft.  No distension. There is no tenderness.  Skin: WDI  Neuro: Mental Status: Patient is awake, alert, oriented to person, place, month. He is unable to give the year. 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 Right pupil reacts, but there is an afferent pupillary defect in the left. III,IV, VI: EOMI without ptosis or diploplia.  V: Facial sensation is symmetric to temperature VII: Facial movement is symmetric.  VIII: hearing is intact to voice X: Uvula elevates symmetrically XI: Shoulder shrug is symmetric. XII: tongue is midline without atrophy or fasciculations.  Motor: Tone is normal. Bulk is normal. 5/5 strength was present in all four extremities.  Sensory: Sensation is symmetric to light touch and temperature in the arms and legs. Cerebellar: He has tremor on finger-nose-finger bilaterally         I have reviewed labs in epic and the results pertinent to this consultation are: Elevated creatinine  I have reviewed the images obtained: CT head-no acute findings  Impression: 81 year old male with recurrent syncope concerning for seizure. Neither the episodes of loss of consciousness or witnessed, but especially with the episode today given the prolonged gradual improvement in mental status I would be very concerned that this represented seizure activity.  I  do think that I have high enough concern for seizures to go ahead and start antiepileptics therapy at this time, though an EEG could be helpful if it did show abnormality.  Recommendations: 1) EEG 2) Keppra 500 mg twice a day   Ritta SlotMcNeill Kirkpatrick, MD [facility] [phone]  If 7pm- 7am, please page neurology on call as listed in AMION.

## 2015-04-10 NOTE — ED Notes (Signed)
Pt stable, ambulatory, states understanding of discharge instructions 

## 2015-04-10 NOTE — ED Notes (Signed)
Neuro at BS.

## 2015-04-11 ENCOUNTER — Encounter (HOSPITAL_COMMUNITY): Payer: Self-pay | Admitting: Internal Medicine

## 2015-04-11 ENCOUNTER — Inpatient Hospital Stay (HOSPITAL_COMMUNITY): Payer: Medicare HMO

## 2015-04-11 ENCOUNTER — Other Ambulatory Visit (HOSPITAL_COMMUNITY): Payer: Self-pay

## 2015-04-11 DIAGNOSIS — I10 Essential (primary) hypertension: Secondary | ICD-10-CM

## 2015-04-11 DIAGNOSIS — I4891 Unspecified atrial fibrillation: Secondary | ICD-10-CM | POA: Insufficient documentation

## 2015-04-11 DIAGNOSIS — R55 Syncope and collapse: Secondary | ICD-10-CM | POA: Diagnosis present

## 2015-04-11 DIAGNOSIS — G9340 Encephalopathy, unspecified: Secondary | ICD-10-CM

## 2015-04-11 DIAGNOSIS — N183 Chronic kidney disease, stage 3 unspecified: Secondary | ICD-10-CM | POA: Diagnosis present

## 2015-04-11 DIAGNOSIS — N179 Acute kidney failure, unspecified: Secondary | ICD-10-CM

## 2015-04-11 LAB — TROPONIN I
Troponin I: 0.03 ng/mL (ref <0.031)
Troponin I: <0.03 ng/mL (ref <0.031)

## 2015-04-11 LAB — CBG MONITORING, ED: Glucose-Capillary: 102 mg/dL — ABNORMAL HIGH (ref 65–99)

## 2015-04-11 LAB — RAPID URINE DRUG SCREEN, HOSP PERFORMED
Amphetamines: NOT DETECTED
BARBITURATES: NOT DETECTED
Benzodiazepines: NOT DETECTED
Cocaine: NOT DETECTED
Opiates: NOT DETECTED
Tetrahydrocannabinol: NOT DETECTED

## 2015-04-11 LAB — GLUCOSE, CAPILLARY: Glucose-Capillary: 90 mg/dL (ref 65–99)

## 2015-04-11 LAB — PROTIME-INR
INR: 1.11 (ref 0.00–1.49)
Prothrombin Time: 14.5 seconds (ref 11.6–15.2)

## 2015-04-11 LAB — TSH: TSH: 1.08 u[IU]/mL (ref 0.350–4.500)

## 2015-04-11 LAB — T4, FREE: Free T4: 0.89 ng/dL (ref 0.61–1.12)

## 2015-04-11 MED ORDER — AMLODIPINE BESYLATE 10 MG PO TABS
10.0000 mg | ORAL_TABLET | Freq: Every day | ORAL | Status: DC
  Administered 2015-04-11 – 2015-04-13 (×3): 10 mg via ORAL
  Filled 2015-04-11 (×3): qty 1

## 2015-04-11 MED ORDER — TIMOLOL MALEATE 0.5 % OP SOLN: 1.0000 [drp] | Freq: Every day | OPHTHALMIC | Status: DC

## 2015-04-11 MED ORDER — ETOMIDATE 2 MG/ML IV SOLN
INTRAVENOUS | Status: AC
  Filled 2015-04-11: qty 20

## 2015-04-11 MED ORDER — LEVETIRACETAM 500 MG PO TABS
500.0000 mg | ORAL_TABLET | Freq: Two times a day (BID) | ORAL | Status: DC
  Administered 2015-04-11 – 2015-04-13 (×5): 500 mg via ORAL
  Filled 2015-04-11 (×8): qty 1

## 2015-04-11 MED ORDER — SUCCINYLCHOLINE CHLORIDE 20 MG/ML IJ SOLN
INTRAMUSCULAR | Status: AC
  Filled 2015-04-11: qty 1

## 2015-04-11 MED ORDER — TIMOLOL MALEATE 0.5 % OP SOLN
1.0000 [drp] | Freq: Every day | OPHTHALMIC | Status: DC
  Administered 2015-04-11 – 2015-04-13 (×3): 1 [drp] via OPHTHALMIC
  Filled 2015-04-11: qty 5

## 2015-04-11 MED ORDER — DONEPEZIL HCL 10 MG PO TABS
10.0000 mg | ORAL_TABLET | Freq: Every day | ORAL | Status: DC
  Administered 2015-04-11 – 2015-04-13 (×3): 10 mg via ORAL
  Filled 2015-04-11 (×3): qty 1

## 2015-04-11 MED ORDER — ROCURONIUM BROMIDE 50 MG/5ML IV SOLN
INTRAVENOUS | Status: AC
  Filled 2015-04-11: qty 2

## 2015-04-11 MED ORDER — SODIUM CHLORIDE 0.9 % IV SOLN
INTRAVENOUS | Status: DC
  Administered 2015-04-11 – 2015-04-12 (×2): via INTRAVENOUS

## 2015-04-11 MED ORDER — HEPARIN SODIUM (PORCINE) 5000 UNIT/ML IJ SOLN
5000.0000 [IU] | Freq: Three times a day (TID) | INTRAMUSCULAR | Status: DC
  Administered 2015-04-11 – 2015-04-13 (×7): 5000 [IU] via SUBCUTANEOUS
  Filled 2015-04-11 (×6): qty 1

## 2015-04-11 MED ORDER — AMLODIPINE BESYLATE 10 MG PO TABS: 10.0000 mg | ORAL_TABLET | Freq: Every day | ORAL | Status: DC

## 2015-04-11 MED ORDER — LIDOCAINE HCL (CARDIAC) 20 MG/ML IV SOLN
INTRAVENOUS | Status: AC
  Filled 2015-04-11: qty 5

## 2015-04-11 MED ORDER — DONEPEZIL HCL 10 MG PO TABS: 10.0000 mg | ORAL_TABLET | Freq: Every day | ORAL | Status: DC

## 2015-04-11 MED ORDER — ALUM & MAG HYDROXIDE-SIMETH 200-200-20 MG/5ML PO SUSP: 30.0000 mL | Freq: Four times a day (QID) | ORAL | Status: DC | PRN

## 2015-04-11 MED ORDER — ACETAMINOPHEN 500 MG PO TABS: 500.0000 mg | ORAL_TABLET | Freq: Four times a day (QID) | ORAL | Status: DC | PRN

## 2015-04-11 MED ORDER — ENSURE ENLIVE PO LIQD
237.0000 mL | Freq: Two times a day (BID) | ORAL | Status: DC
  Administered 2015-04-11 – 2015-04-12 (×2): 237 mL via ORAL

## 2015-04-11 MED ORDER — SODIUM CHLORIDE 0.9 % IV SOLN
INTRAVENOUS | Status: DC
  Administered 2015-04-11 (×2): via INTRAVENOUS

## 2015-04-11 NOTE — Consult Note (Signed)
Patient ID: Wesley DevoidRaymond Cook MRN: [id number] DOB/AGE: [birth year] 79 y.o.  Admit date: 04/10/2015 Referring Physician: Waymon AmatoHongalgi Primary Cardiologist: Eldridge DaceVaranasi Reason for Consultation: Syncope  HPI: 81 yo male with history of HTN, sinus node dysfunction s/p PPM, CKD admitted after syncopal event. He was found at the bottom of the stairs. He has no memory of his fall. No chest pain or SOB. Neurology suspects seizures.   He denies any chest pain, SOB, palpitations, lower ext edema.    Past Medical History  Diagnosis Date  . Hypertension   . Sinus node dysfunction     a. s/p MDT dual chamber pacemaker implanted by Dr Amil AmenEdmunds  . CKD (chronic kidney disease), stage III   . Pacemaker   . Headache     Family History  Problem Relation Age of Onset  . Cancer Father     History   Social History  . Marital Status: Single    Spouse Name: N/A  . Number of Children: N/A  . Years of Education: N/A   Occupational History  . Not on file.   Social History Main Topics  . Smoking status: Never Smoker   . Smokeless tobacco: Never Used  . Alcohol Use: No  . Drug Use: No  . Sexual Activity: Not on file   Other Topics Concern  . Not on file   Social History Narrative    Past Surgical History  Procedure Laterality Date  . Pacemaker insertion  2007    MDT dual chamber pacemaker implanted for sinus node dysfunction by Dr Ty HiltsEdmonds  . Cardiac catheterization  2007    normal coronaries    No Known Allergies  Prior to Admission medications   Medication Sig Start Date End Date Taking? Authorizing Provider  acetaminophen (TYLENOL) 500 MG tablet Take 500 mg by mouth every 6 (six) hours as needed for mild pain or moderate pain.   Yes Historical Provider, MD  donepezil (ARICEPT) 10 MG tablet Take 10 mg by mouth daily.   Yes Historical Provider, MD  lisinopril-hydrochlorothiazide (PRINZIDE,ZESTORETIC) 20-12.5 MG per tablet TAKE 1 TABLET BY MOUTH EVERY DAY 04/03/14  Yes Corky CraftsJayadeep S Varanasi, MD   timolol (TIMOPTIC) 0.5 % ophthalmic solution Place 1 drop into both eyes daily.  10/31/14  Yes Historical Provider, MD   Hospital Medications:  . amLODipine  10 mg Oral Daily  . donepezil  10 mg Oral Daily  . feeding supplement (ENSURE ENLIVE)  237 mL Oral BID BM  . heparin  5,000 Units Subcutaneous 3 times per day  . levETIRAcetam  500 mg Oral BID  . timolol  1 drop Both Eyes Daily    Review of systems complete and found to be negative unless listed above   Physical Exam: Blood pressure 132/68, pulse 60, temperature 98 F (36.7 C), temperature source Oral, resp. rate 20, weight 183 lb 1.6 oz (83.054 kg), SpO2 99 %.    General: Well developed, well nourished, NAD  HEENT: OP clear, mucus membranes moist  SKIN: warm, dry. No rashes.  Neuro: No focal deficits  Musculoskeletal: Muscle strength 5/5 all ext  Psychiatric: Mood and affect normal  Neck: No JVD, no carotid bruits, no thyromegaly, no lymphadenopathy.  Lungs:Clear bilaterally, no wheezes, rhonci, crackles  Cardiovascular: Regular rate and rhythm. Soft systolic murmur. No gallops or rubs.  Abdomen:Soft. Bowel sounds present. Non-tender.  Extremities: No lower extremity edema. Pulses are 2 + in the bilateral DP/PT.  Labs:   Lab Results  Component Value Date  WBC 6.9 04/10/2015   HGB 14.6 04/10/2015   HCT 42.7 04/10/2015   MCV 87.7 04/10/2015   PLT PLATELET CLUMPS NOTED ON SMEAR, UNABLE TO ESTIMATE 04/10/2015     Recent Labs Lab 04/10/15 2156  NA 137  K 4.3  CL 101  CO2 26  BUN 21*  CREATININE 1.64*  CALCIUM 9.3  PROT 7.1  BILITOT 0.4  ALKPHOS 58  ALT 22  AST 28  GLUCOSE 157*   Lab Results  Component Value Date             TROPONINI <0.03 04/11/2015   EKG: paced rhythm  ASSESSMENT AND PLAN:   1. Syncope: Device interrogation shows normal pacemaker function. He did have episodes of atrial fibrillation. This is new for him but not likely related to his fall. I am not sure that he would be a good  candidate for long term anti-coagulation but his memory seems intact. Will arrange echo to evaluate LV function. Consider carotid artery dopplers. Will follow on telemetry. Further recs to follow after echo.    Signed: Verne Carrow, MD 04/11/2015, 5:47 PM

## 2015-04-11 NOTE — ED Notes (Signed)
Patient currently in EEG bed moved from POD D35 to POC C 27.

## 2015-04-11 NOTE — ED Notes (Signed)
EEG called, patient to be transported off unit without telemetry per orders.

## 2015-04-11 NOTE — ED Notes (Addendum)
C-collar to remain on patient until admitting physician can see patient.  Will see pt soon.  According to note, pt is to remain NPO until mental status improves, dinner tray removed from room and will await admitting physician instructions regarding food.

## 2015-04-11 NOTE — Progress Notes (Signed)
EEG completed, results pending. 

## 2015-04-11 NOTE — Progress Notes (Signed)
PT Cancellation Note  Patient Details Name: Wesley Cook MRN: [id number] DOB: Jul 22, 1934   Cancelled Treatment:    Reason Eval/Treat Not Completed: Medical issues which prohibited therapy.  Patient on strict bedrest per orders.  MD:  Please write activity orders when appropriate for patient.  PT will initiate evaluation at that time.  Thank you.   Vena AustriaDavis, Oumar Marcott H 04/11/2015, 10:00 AM Durenda HurtSusan H. Renaldo Fiddleravis, PT, [facility] Acute Rehab Services Pager [phone]

## 2015-04-11 NOTE — Progress Notes (Addendum)
PROGRESS NOTE    Wesley Cook ZOX:[id number] DOB: 03-Jul-1934 DOA: 04/10/2015 PCP: Geraldo Pitter, MD  Primary Cardiologist: Dr. Lance Muss  HPI/Brief narrative 81 year old male with history of HTN, sinus node dysfunction status post [employer] PPM (placed in 2007 for syncope and cardiac cath at this time showed no significant disease), dementia, CKD 3, presented to the [facility] ED on 04/10/15 after found unresponsive at the bottom of the stairs on a stairwell at his apartment complex. As per EDP documentation, patient was completely unresponsive with GCS of 8, his pacemaker was not firing, he was hypoxic with inadequate respiratory effort, he had pulses and blood pressure but was not following commands. He required bag valve mask ventilation on Route to the hospital. He was given supplemental oxygen. In the ED, he initially continue to be less responsive and was about to be intubated but mental status started improving and patient was able to protect his airway. He had been seen in the ED on 04/09/15 for abdominal pain and vomiting according to the patient started an hour after eating a salami sandwich. He had also been found on the floor but no clear history of fall or LOC. At that time CT head, abdomen and pelvis showed no acute abnormality and patient had been discharged home.   Assessment/Plan:  Syncope versus seizures - Patient noted to be unresponsive on 6/1. He was found down on the ground on 5/31 but no clear report regarding LOC. Patient has no recollection of events. - Neurology consultation appreciated and our concern for seizures. - Follow EEG - Started on Keppra - Telemetry shows atrial paced rhythm - Pacemaker apparently interrogated in the ED showed tachyarrhythmia-have to look for report. Will request cardiology input for possible cardiac etiology for syncope. Patient had pacemaker placed in 2007 for syncope. - Mental status probably return to baseline. - CT head and cervical  spine without acute abnormalities. - UDS: Negative - 2-D echo May 2015: LVEF 65-70 percent.   Essential hypertension - Controlled. - Lisinopril/HCTZ switched to amlodipine due to acute kidney injury   Acute kidney injury - Likely related to GI losses from recent nausea, vomiting, dehydration and ACEI - Hold culprit medications and briefly hydrate - Follow BMP - Improving.   Possible food poisoning versus acute GE  - Patient states that his symptoms started an hour after eating a salami sandwich  - Improved/resolved.  - Regular consistency diet and monitor   Dementia  - Mental status probably has returned to baseline  Transient new onset A. Fib - Initial EKG on 04/10/15 showed A. fib with ventricular rate at 91 bpm and no acute changes. Subsequent EKGs have showed atrial paced rhythm. - Remains in atrial paced rhythm on monitor. - CHA2DS2-VASc Score: 2 for which oral anticoagulation would be recommended but since A. fib seems transient, not started unless he has recurrence.  - Troponin 2: Negative  - TSH: 1.080, free T4 0.89 and free T3: Pending  DVT prophylaxis: Heparin subcutaneous Code Status: Full Family Communication: None at bedside Disposition Plan: Change status to telemetry. DC home when medically stable.   Consultants:  Neurology  cardiology  Procedures:  None  Antibiotics:  None   Subjective: Seen this morning. Denied complaints. Has no recollection of events. Asking for something to eat. Denies headache or neck pain. Had cervical collar on.  Objective: Filed Vitals:   04/11/15 1000 04/11/15 1015 04/11/15 1033 04/11/15 1210  BP: 133/71 122/92 122/73 132/68  Pulse: 60 59 60   Temp:  98 F (36.7 C)   TempSrc:   Oral   Resp: Weight:    83.054 kg (183 lb 1.6 oz)  SpO2: 98% 98% 99%     Intake/Output Summary (Last 24 hours) at 04/11/15 1423 Last data filed at 04/11/15 1026  Gross per 24 hour  Intake    100 ml  Output    310 ml  Net    -210 ml   Filed Weights   04/11/15 1210  Weight: 83.054 kg (183 lb 1.6 oz)     Exam:  General exam: pleasant elderly male lying comfortably supine in bed. Respiratory system: Clear. No increased work of breathing. Cardiovascular system: S1 & S2 heard, RRR. No JVD, murmurs, gallops, clicks or pedal edema. telemetry: Atrial paced rhythm.  Gastrointestinal system: Abdomen is nondistended, soft and nontender. Normal bowel sounds heard. Central nervous system: Alert and oriented to person, place and partially to time . No focal neurological deficits. Extremities: Symmetric 5 x 5 power.   Data Reviewed: Basic Metabolic Panel:  Recent Labs Lab 04/09/15 2138 04/10/15 2151 04/10/15 2156  NA 137 138 137  K 4.0 4.3 4.3  CL 101 100* 101  CO2 23  --  26  GLUCOSE 107* 156* 157*  BUN 27* 24* 21*  CREATININE 1.93* 1.60* 1.64*  CALCIUM 9.2  --  9.3   Liver Function Tests:  Recent Labs Lab 04/09/15 2138 04/10/15 2156  AST 26 28  ALT 22 22  ALKPHOS 59 58  BILITOT 0.8 0.4  PROT 7.0 7.1  ALBUMIN 4.0 4.1    Recent Labs Lab 04/09/15 2138  LIPASE 24   No results for input(s): AMMONIA in the last 168 hours. CBC:  Recent Labs Lab 04/10/15 0053 04/10/15 2151 04/10/15 2156  WBC 8.3  --  6.9  NEUTROABS 6.4  --  4.7  HGB 14.2 15.3 14.6  HCT 41.1 45.0 42.7  MCV 86.9  --  87.7  PLT 122*  --  PLATELET CLUMPS NOTED ON SMEAR, UNABLE TO ESTIMATE   Cardiac Enzymes:  Recent Labs Lab 04/11/15 0145 04/11/15 0733  TROPONINI 0.03 <0.03   BNP (last 3 results) No results for input(s): PROBNP in the last 8760 hours. CBG:  Recent Labs Lab 04/10/15 2141 04/11/15 0816 04/11/15 1203  GLUCAP 128* 102* 90    No results found for this or any previous visit (from the past 240 hour(s)).         Studies: Ct Abdomen Pelvis Wo Contrast  04/10/2015   CLINICAL DATA:  Abdominal pain and vomiting.  Lower abdominal pain.  EXAM: CT ABDOMEN AND PELVIS WITHOUT CONTRAST  TECHNIQUE:  Multidetector CT imaging of the abdomen and pelvis was performed following the standard protocol without IV contrast.  COMPARISON:  Contrast-enhanced exam 05/12/2014  FINDINGS: Mild motion artifact through the lung bases. There is dependent atelectasis. Cardiac pacer leads are partially included. The heart size is normal.  Hepatic granulomas are unchanged, no evidence focal hepatic lesion on noncontrast exam. Gallbladder is physiologically distended. There is no biliary dilatation. The unenhanced spleen, pancreas, and adrenal glands are normal.  Extrarenal pelvis configuration of the right kidney, unchanged from prior exam. No hydronephrosis or renal stone. Left kidney is normal. Vascular calcification noted of the right renal hila. No perinephric stranding.  Small hiatal hernia. Stomach is distended with ingested contrast. There are no dilated or thickened bowel loops. Small volume of colonic stool without colonic wall thickening. Mild to moderate distal colonic diverticulosis without diverticulitis.  The appendix is normal. No free air, free fluid, or intra-abdominal fluid collection.  No retroperitoneal adenopathy. Abdominal aorta is normal in caliber. Moderate atherosclerosis of the abdominal aorta and its branches.  Within the pelvis the urinary bladder is physiologically distended. Prostate gland is normal in size. There is no pelvic free fluid or adenopathy.  Multilevel degenerative change throughout the spine with grade 1 anterolisthesis of L4 on L5 that appears degenerative. There are no acute or suspicious osseous abnormalities. Degenerative change of both hips and sacroiliac joints.  IMPRESSION: 1. No acute abnormality in the abdomen/pelvis. 2. Small hiatal hernia. Distal colonic diverticulosis without diverticulitis.   Electronically Signed   By: Rubye OaksMelanie  Ehinger M.D.   On: 04/10/2015 00:56   Ct Head Wo Contrast  04/10/2015   CLINICAL DATA:  Patient found unresponsive. Syncopal episode yesterday with  altered mental status.  EXAM: CT HEAD WITHOUT CONTRAST  CT CERVICAL SPINE WITHOUT CONTRAST  TECHNIQUE: Multidetector CT imaging of the head and cervical spine was performed following the standard protocol without intravenous contrast. Multiplanar CT image reconstructions of the cervical spine were also generated.  COMPARISON:  CT of the head on 04/09/2015 and 05/12/2014  FINDINGS: CT HEAD FINDINGS  Stable advanced small vessel ischemic disease in the periventricular white matter and moderate cortical atrophy. The brain demonstrates no evidence of hemorrhage, acute infarction, edema, mass effect, extra-axial fluid collection, hydrocephalus or mass lesion. The skull is unremarkable.  CT CERVICAL SPINE FINDINGS  The cervical spine shows normal alignment. There is no evidence of acute fracture or subluxation. No soft tissue swelling or hematoma is identified. Advanced cervical spondylosis noted at C5-6, C6-7 and C7-T1.  No bony or soft tissue lesions are seen. The visualized airway is normally patent.  IMPRESSION: 1. Stable atrophy and small vessel disease. No acute intracranial findings. 2. No evidence of cervical fracture. Advanced cervical spondylosis present.   Electronically Signed   By: Irish LackGlenn  Yamagata M.D.   On: 04/10/2015 22:43   Ct Head Wo Contrast  04/10/2015   CLINICAL DATA:  Acute onset of vomiting. Lying on floor. Altered mental status. Initial encounter.  EXAM: CT HEAD WITHOUT CONTRAST  TECHNIQUE: Contiguous axial images were obtained from the base of the skull through the vertex without intravenous contrast.  COMPARISON:  CT of the head performed 05/12/2014  FINDINGS: There is no evidence of acute infarction, mass lesion, or intra- or extra-axial hemorrhage on CT.  Prominence of the ventricles and sulci reflects mild cortical volume loss. Scattered periventricular and subcortical white matter change reflects small vessel ischemic microangiopathy. Chronic lacunar infarcts are noted at the basal ganglia  bilaterally.  The brainstem and fourth ventricle are within normal limits. The cerebral hemispheres demonstrate grossly normal gray-white differentiation. No mass effect or midline shift is seen.  There is no evidence of fracture; visualized osseous structures are unremarkable in appearance. The orbits are within normal limits. Mild mucosal thickening is noted at the frontal sinuses bilaterally. The remaining paranasal sinuses and mastoid air cells are well-aerated. No significant soft tissue abnormalities are seen.  IMPRESSION: 1. No acute intracranial pathology seen on CT. 2. Mild cortical volume loss and scattered small vessel ischemic microangiopathy. Chronic lacunar infarcts at the basal ganglia bilaterally. 3. Mild mucosal thickening at the frontal sinuses bilaterally.   Electronically Signed   By: Roanna RaiderJeffery  Chang M.D.   On: 04/10/2015 00:42   Ct Cervical Spine Wo Contrast  04/10/2015   CLINICAL DATA:  Patient found unresponsive. Syncopal episode yesterday with altered mental  status.  EXAM: CT HEAD WITHOUT CONTRAST  CT CERVICAL SPINE WITHOUT CONTRAST  TECHNIQUE: Multidetector CT imaging of the head and cervical spine was performed following the standard protocol without intravenous contrast. Multiplanar CT image reconstructions of the cervical spine were also generated.  COMPARISON:  CT of the head on 04/09/2015 and 05/12/2014  FINDINGS: CT HEAD FINDINGS  Stable advanced small vessel ischemic disease in the periventricular white matter and moderate cortical atrophy. The brain demonstrates no evidence of hemorrhage, acute infarction, edema, mass effect, extra-axial fluid collection, hydrocephalus or mass lesion. The skull is unremarkable.  CT CERVICAL SPINE FINDINGS  The cervical spine shows normal alignment. There is no evidence of acute fracture or subluxation. No soft tissue swelling or hematoma is identified. Advanced cervical spondylosis noted at C5-6, C6-7 and C7-T1.  No bony or soft tissue lesions are  seen. The visualized airway is normally patent.  IMPRESSION: 1. Stable atrophy and small vessel disease. No acute intracranial findings. 2. No evidence of cervical fracture. Advanced cervical spondylosis present.   Electronically Signed   By: Irish Lack M.D.   On: 04/10/2015 22:43   Dg Chest Portable 1 View  04/10/2015   CLINICAL DATA:  Altered mental status and abdominal pain.  EXAM: PORTABLE CHEST - 1 VIEW  COMPARISON:  05/12/2012  FINDINGS: Bibasilar opacities are consistent with atelectasis versus infiltrates. Aspiration pneumonia cannot be excluded. No evidence of pulmonary edema or pleural effusion. No pneumothorax is identified. The heart size is normal. There is stable appearance to a dual-chamber pacemaker.  IMPRESSION: Bibasilar atelectasis versus infiltrates.   Electronically Signed   By: Irish Lack M.D.   On: 04/10/2015 22:17        Scheduled Meds: . amLODipine  10 mg Oral Daily  . donepezil  10 mg Oral Daily  . feeding supplement (ENSURE ENLIVE)  237 mL Oral BID BM  . heparin  5,000 Units Subcutaneous 3 times per day  . levETIRAcetam  500 mg Oral BID  . timolol  1 drop Both Eyes Daily   Continuous Infusions: . sodium chloride 100 mL/hr at 04/11/15 1307    Principal Problem:   Acute encephalopathy Active Problems:   Essential hypertension, benign   Cardiac pacemaker in situ   Sinus node dysfunction   Chronic kidney disease (CKD), stage III (moderate)   New onset a-fib    Time spent: 35 minutes.    Marcellus Scott, MD, FACP, FHM. [facility] Pager [phone]  If 7PM-7AM, please contact night-coverage [email or web address] Password [email or web address] 04/11/2015, 2:23 PM    LOS: 1 day

## 2015-04-11 NOTE — ED Notes (Signed)
Verbal order from Dr. Clyde LundborgNiu to feed patient if he remains alert.

## 2015-04-11 NOTE — Progress Notes (Signed)
OT Cancellation Note  Patient Details Name: Wesley Cook MRN: [id number] DOB: 12-Apr-1934   Cancelled Treatment:    Reason Eval/Treat Not Completed: Patient at procedure or test/ unavailable. Pt at EEG when OT arrived. Acute OT will follow up as available to complete evaluation.   Nena JordanMiller, Josephine Wooldridge M   Carney LivingLeeAnn Marie Kendra Grissett, OTR/L Occupational Therapist [phone] (pager)  04/11/2015, 3:25 PM

## 2015-04-11 NOTE — Evaluation (Signed)
Physical Therapy Evaluation Patient Details Name: Wesley Cook MRN: [id number] DOB: 08-Jun-1934 Today's Date: 04/11/2015   History of Present Illness  Patient is a 81 yo male admitted 04/10/15 with AMS.  PMH:  HTN, sinus node dysfunction, pacemaker, dementia, CKD  Clinical Impression  Patient presents with problems listed below.  Will benefit from acute PT to maximize functional mobility prior to discharge.  Patient with decreased balance, strength, cognition, all impacting functional mobility and safety.  Recommend SNF at discharge for continued therapy.    Follow Up Recommendations SNF;Supervision/Assistance - 24 hour    Equipment Recommendations  Rolling walker with 5" wheels    Recommendations for Other Services       Precautions / Restrictions Precautions Precautions: Fall Precaution Comments: Found after falls x2 pta (? seizures) Restrictions Weight Bearing Restrictions: No      Mobility  Bed Mobility Overal bed mobility: Needs Assistance Bed Mobility: Supine to Sit;Sit to Supine     Supine to sit: Min assist Sit to supine: Min guard   General bed mobility comments: Verbal cues for technique.  Assist to steady while moving to sitting.  Once upright, patient with good sitting balance.  Patient sat EOB x 12 minutes.  Transfers Overall transfer level: Needs assistance Equipment used: 1 person hand held assist Transfers: Sit to/from Stand Sit to Stand: Min assist         General transfer comment: Verbal cues for hand placement.  Patient required repeated cues to initiate movement.  Assist to rise to standing and for balance.  Patient stood x2 for 60 seconds each time.  Assist for balance.  Patient fatigued quickly.  Ambulation/Gait             General Gait Details: Patient declined  Stairs            Wheelchair Mobility    Modified Rankin (Stroke Patients Only)       Balance Overall balance assessment: Needs assistance         Standing  balance support: Single extremity supported Standing balance-Leahy Scale: Poor                               Pertinent Vitals/Pain Pain Assessment: No/denies pain    Home Living Family/patient expects to be discharged to:: Private residence Living Arrangements: Spouse/significant other Available Help at Discharge: Family (Unsure - patient reports he is alone during day) Type of Home: Apartment Home Access: Stairs to enter Entrance Stairs-Rails: Right Entrance Stairs-Number of Steps: 3 Home Layout: One level Home Equipment: None Additional Comments: Information from patient - unsure of reliability    Prior Function Level of Independence: Independent         Comments: Information from patient - unsure of reliability     Hand Dominance        Extremity/Trunk Assessment   Upper Extremity Assessment: Overall WFL for tasks assessed           Lower Extremity Assessment: Generalized weakness      Cervical / Trunk Assessment: Normal  Communication   Communication: No difficulties  Cognition Arousal/Alertness: Awake/alert Behavior During Therapy: WFL for tasks assessed/performed (Tearful at end of session.  "I don't like to be alone") Overall Cognitive Status: No family/caregiver present to determine baseline cognitive functioning Area of Impairment: Memory;Safety/judgement;Problem solving     Memory: Decreased short-term memory   Safety/Judgement: Decreased awareness of deficits;Decreased awareness of safety   Problem Solving: Slow processing;Difficulty sequencing;Requires  verbal cues General Comments: Patient tearful at end of session.  Talked about family, repeating himself quite often.  He gave different information related to his children (number and where they live, what they do).    General Comments      Exercises        Assessment/Plan    PT Assessment Patient needs continued PT services  PT Diagnosis Difficulty walking;Generalized  weakness;Altered mental status   PT Problem List Decreased strength;Decreased activity tolerance;Decreased balance;Decreased mobility;Decreased cognition;Decreased knowledge of use of DME;Decreased safety awareness  PT Treatment Interventions DME instruction;Gait training;Functional mobility training;Therapeutic activities;Therapeutic exercise;Patient/family education;Cognitive remediation   PT Goals (Current goals can be found in the Care Plan section) Acute Rehab PT Goals Patient Stated Goal: To get stronger PT Goal Formulation: With patient Time For Goal Achievement: 04/18/15 Potential to Achieve Goals: Good    Frequency Min 3X/week   Barriers to discharge Decreased caregiver support Does not appear to have 24 hour assist    Co-evaluation               End of Session Equipment Utilized During Treatment: Gait belt Activity Tolerance: Patient limited by fatigue Patient left: in bed;with call bell/phone within reach;with bed alarm set Nurse Communication: Mobility status (Tearful)         Time: [id number] PT Time Calculation (min) (ACUTE ONLY): 43 min   Charges:   PT Evaluation $Initial PT Evaluation Tier I: 1 Procedure PT Treatments $Therapeutic Activity: 8-22 mins   PT G CodesVena Austria:        [name] 04/11/2015, [name], PT, [name]

## 2015-04-11 NOTE — Procedures (Signed)
EEG report.  Brief clinical history: 81 year old male with recurrent syncope concerning for seizure. No further episodes while in the hospital.   Technique: this is a 17 channel routine scalp EEG performed at the bedside with bipolar and monopolar montages arranged in accordance to the international 10/20 system of electrode placement. One channel was dedicated to EKG recording.  The study was performed during wakefulness, drowsiness, and stage 2 sleep. No activating procedures performed.  Description: Patient falls rapidly into sleep as the study begins and throughout the most part of the recording, but during a brief period of wakefulness he is able to achieve a best background that consisted of a medium amplitude, posterior dominant, well sustained, symmetric and reactive 8 Hz rhythm. Drowsiness demonstrated dropout of the alpha rhythm. Stage 2 sleep showed symmetric and synchronous sleep spindles without intermixed epileptiform discharges. No focal or generalized epileptiform discharges noted.  No pathologic areas of slowing seen.  EKG showed sinus rhythm.   Impression: this is a normal awake and asleep EEG. Please, be aware that a normal EEG does not exclude the possibility of epilepsy.  Clinical correlation is advised.   Wyatt Portelasvaldo Ares Tegtmeyer, MD [facility]

## 2015-04-11 NOTE — ED Notes (Signed)
Pt stated that he was hungry and thirsty. Informed RN.

## 2015-04-11 NOTE — Progress Notes (Signed)
Subjective: Patient has no complaints. No further episodes.   Objective: Current vital signs: BP 122/73 mmHg  Pulse 60  Temp(Src) 98 F (36.7 C) (Oral)  Resp 20  SpO2 99% Vital signs in last 24 hours: Temp:  [97.2 F (36.2 C)-98 F (36.7 C)] 98 F (36.7 C) (06/02 1033) Pulse Rate:  [58-86] 60 (06/02 1033) Resp:  [7-30] 20 (06/02 1033) BP: (95-168)/(44-96) 122/73 mmHg (06/02 1033) SpO2:  [95 %-100 %] 99 % (06/02 1033)  Intake/Output from previous day: 06/01 0701 - 06/02 0700 In: 100 [I.V.:100] Out: 50 [Urine:50] Intake/Output this shift: Total I/O In: -  Out: 260 [Urine:260] Nutritional status: Diet NPO time specified  Neurologic Exam:  Mental Status: Alert, oriented, thought content appropriate.  Speech fluent without evidence of aphasia.  Able to follow 3 step commands without difficulty. Cranial Nerves: II: Visual fields grossly normal in right eye, blind in left eye.  Right pupil round and reactive 2mm and left pupil showing a APD.  III,IV, VI: ptosis not present, extra-ocular motions intact bilaterally V,VII: smile symmetric, facial light touch sensation normal bilaterally VIII: hearing normal bilaterally IX,X: uvula rises symmetrically XI: bilateral shoulder shrug XII: midline tongue extension without atrophy or fasciculations  Motor: Right : Upper extremity   5/5    Left:     Upper extremity   5/5  Lower extremity   5/5     Lower extremity   5/5 Tone and bulk:normal tone throughout; no atrophy noted Sensory: Pinprick and light touch intact throughout, bilaterally Deep Tendon Reflexes:  Right: Upper Extremity   Left: Upper extremity   biceps (C-5 to C-6) 2/4   biceps (C-5 to C-6) 2/4 tricep (C7) 2/4    triceps (C7) 2/4 Brachioradialis (C6) 2/4  Brachioradialis (C6) 2/4  Lower Extremity Lower Extremity  quadriceps (L-2 to L-4) 1/4   quadriceps (L-2 to L-4) 1/4 Achilles (S1) 1/4   Achilles (S1) 1/4  Plantars: Right: downgoing   Left:  downgoing Cerebellar: normal finger-to-nose with postural tremor,  normal heel-to-shin test    Lab Results: Basic Metabolic Panel:  Recent Labs Lab 04/09/15 2138 04/10/15 2151 04/10/15 2156  NA 137 138 137  K 4.0 4.3 4.3  CL 101 100* 101  CO2 23  --  26  GLUCOSE 107* 156* 157*  BUN 27* 24* 21*  CREATININE 1.93* 1.60* 1.64*  CALCIUM 9.2  --  9.3    Liver Function Tests:  Recent Labs Lab 04/09/15 2138 04/10/15 2156  AST 26 28  ALT 22 22  ALKPHOS 59 58  BILITOT 0.8 0.4  PROT 7.0 7.1  ALBUMIN 4.0 4.1    Recent Labs Lab 04/09/15 2138  LIPASE 24   No results for input(s): AMMONIA in the last 168 hours.  CBC:  Recent Labs Lab 04/10/15 0053 04/10/15 2151 04/10/15 2156  WBC 8.3  --  6.9  NEUTROABS 6.4  --  4.7  HGB 14.2 15.3 14.6  HCT 41.1 45.0 42.7  MCV 86.9  --  87.7  PLT 122*  --  PLATELET CLUMPS NOTED ON SMEAR, UNABLE TO ESTIMATE    Cardiac Enzymes:  Recent Labs Lab 04/11/15 0145 04/11/15 0733  TROPONINI 0.03 <0.03    Lipid Panel: No results for input(s): CHOL, TRIG, HDL, CHOLHDL, VLDL, LDLCALC in the last 168 hours.  CBG:  Recent Labs Lab 04/10/15 2141 04/11/15 0816  GLUCAP 128* 102*    Microbiology: No results found for this or any previous visit.  Coagulation Studies:  Recent Labs  04/11/15  0145  LABPROT 14.5  INR 1.11    Imaging: Ct Abdomen Pelvis Wo Contrast  04/10/2015   CLINICAL DATA:  Abdominal pain and vomiting.  Lower abdominal pain.  EXAM: CT ABDOMEN AND PELVIS WITHOUT CONTRAST  TECHNIQUE: Multidetector CT imaging of the abdomen and pelvis was performed following the standard protocol without IV contrast.  COMPARISON:  Contrast-enhanced exam 05/12/2014  FINDINGS: Mild motion artifact through the lung bases. There is dependent atelectasis. Cardiac pacer leads are partially included. The heart size is normal.  Hepatic granulomas are unchanged, no evidence focal hepatic lesion on noncontrast exam. Gallbladder is  physiologically distended. There is no biliary dilatation. The unenhanced spleen, pancreas, and adrenal glands are normal.  Extrarenal pelvis configuration of the right kidney, unchanged from prior exam. No hydronephrosis or renal stone. Left kidney is normal. Vascular calcification noted of the right renal hila. No perinephric stranding.  Small hiatal hernia. Stomach is distended with ingested contrast. There are no dilated or thickened bowel loops. Small volume of colonic stool without colonic wall thickening. Mild to moderate distal colonic diverticulosis without diverticulitis. The appendix is normal. No free air, free fluid, or intra-abdominal fluid collection.  No retroperitoneal adenopathy. Abdominal aorta is normal in caliber. Moderate atherosclerosis of the abdominal aorta and its branches.  Within the pelvis the urinary bladder is physiologically distended. Prostate gland is normal in size. There is no pelvic free fluid or adenopathy.  Multilevel degenerative change throughout the spine with grade 1 anterolisthesis of L4 on L5 that appears degenerative. There are no acute or suspicious osseous abnormalities. Degenerative change of both hips and sacroiliac joints.  IMPRESSION: 1. No acute abnormality in the abdomen/pelvis. 2. Small hiatal hernia. Distal colonic diverticulosis without diverticulitis.   Electronically Signed   By: Rubye Oaks M.D.   On: 04/10/2015 00:56   Ct Head Wo Contrast  04/10/2015   CLINICAL DATA:  Patient found unresponsive. Syncopal episode yesterday with altered mental status.  EXAM: CT HEAD WITHOUT CONTRAST  CT CERVICAL SPINE WITHOUT CONTRAST  TECHNIQUE: Multidetector CT imaging of the head and cervical spine was performed following the standard protocol without intravenous contrast. Multiplanar CT image reconstructions of the cervical spine were also generated.  COMPARISON:  CT of the head on 04/09/2015 and 05/12/2014  FINDINGS: CT HEAD FINDINGS  Stable advanced small vessel  ischemic disease in the periventricular white matter and moderate cortical atrophy. The brain demonstrates no evidence of hemorrhage, acute infarction, edema, mass effect, extra-axial fluid collection, hydrocephalus or mass lesion. The skull is unremarkable.  CT CERVICAL SPINE FINDINGS  The cervical spine shows normal alignment. There is no evidence of acute fracture or subluxation. No soft tissue swelling or hematoma is identified. Advanced cervical spondylosis noted at C5-6, C6-7 and C7-T1.  No bony or soft tissue lesions are seen. The visualized airway is normally patent.  IMPRESSION: 1. Stable atrophy and small vessel disease. No acute intracranial findings. 2. No evidence of cervical fracture. Advanced cervical spondylosis present.   Electronically Signed   By: Irish Lack M.D.   On: 04/10/2015 22:43   Ct Head Wo Contrast  04/10/2015   CLINICAL DATA:  Acute onset of vomiting. Lying on floor. Altered mental status. Initial encounter.  EXAM: CT HEAD WITHOUT CONTRAST  TECHNIQUE: Contiguous axial images were obtained from the base of the skull through the vertex without intravenous contrast.  COMPARISON:  CT of the head performed 05/12/2014  FINDINGS: There is no evidence of acute infarction, mass lesion, or intra- or extra-axial hemorrhage  on CT.  Prominence of the ventricles and sulci reflects mild cortical volume loss. Scattered periventricular and subcortical white matter change reflects small vessel ischemic microangiopathy. Chronic lacunar infarcts are noted at the basal ganglia bilaterally.  The brainstem and fourth ventricle are within normal limits. The cerebral hemispheres demonstrate grossly normal gray-white differentiation. No mass effect or midline shift is seen.  There is no evidence of fracture; visualized osseous structures are unremarkable in appearance. The orbits are within normal limits. Mild mucosal thickening is noted at the frontal sinuses bilaterally. The remaining paranasal sinuses  and mastoid air cells are well-aerated. No significant soft tissue abnormalities are seen.  IMPRESSION: 1. No acute intracranial pathology seen on CT. 2. Mild cortical volume loss and scattered small vessel ischemic microangiopathy. Chronic lacunar infarcts at the basal ganglia bilaterally. 3. Mild mucosal thickening at the frontal sinuses bilaterally.   Electronically Signed   By: Roanna RaiderJeffery  Chang M.D.   On: 04/10/2015 00:42   Ct Cervical Spine Wo Contrast  04/10/2015   CLINICAL DATA:  Patient found unresponsive. Syncopal episode yesterday with altered mental status.  EXAM: CT HEAD WITHOUT CONTRAST  CT CERVICAL SPINE WITHOUT CONTRAST  TECHNIQUE: Multidetector CT imaging of the head and cervical spine was performed following the standard protocol without intravenous contrast. Multiplanar CT image reconstructions of the cervical spine were also generated.  COMPARISON:  CT of the head on 04/09/2015 and 05/12/2014  FINDINGS: CT HEAD FINDINGS  Stable advanced small vessel ischemic disease in the periventricular white matter and moderate cortical atrophy. The brain demonstrates no evidence of hemorrhage, acute infarction, edema, mass effect, extra-axial fluid collection, hydrocephalus or mass lesion. The skull is unremarkable.  CT CERVICAL SPINE FINDINGS  The cervical spine shows normal alignment. There is no evidence of acute fracture or subluxation. No soft tissue swelling or hematoma is identified. Advanced cervical spondylosis noted at C5-6, C6-7 and C7-T1.  No bony or soft tissue lesions are seen. The visualized airway is normally patent.  IMPRESSION: 1. Stable atrophy and small vessel disease. No acute intracranial findings. 2. No evidence of cervical fracture. Advanced cervical spondylosis present.   Electronically Signed   By: Irish LackGlenn  Yamagata M.D.   On: 04/10/2015 22:43   Dg Chest Portable 1 View  04/10/2015   CLINICAL DATA:  Altered mental status and abdominal pain.  EXAM: PORTABLE CHEST - 1 VIEW  COMPARISON:   05/12/2012  FINDINGS: Bibasilar opacities are consistent with atelectasis versus infiltrates. Aspiration pneumonia cannot be excluded. No evidence of pulmonary edema or pleural effusion. No pneumothorax is identified. The heart size is normal. There is stable appearance to a dual-chamber pacemaker.  IMPRESSION: Bibasilar atelectasis versus infiltrates.   Electronically Signed   By: Irish LackGlenn  Yamagata M.D.   On: 04/10/2015 22:17    Medications:  Scheduled: . amLODipine  10 mg Oral Daily  . donepezil  10 mg Oral Daily  . heparin  5,000 Units Subcutaneous 3 times per day  . levETIRAcetam  500 mg Oral BID  . timolol  1 drop Both Eyes Daily    Assessment/Plan: 81 year old male with recurrent syncope concerning for seizure. No further episodes while in ED.   Recommend: 1) Continue Keppra 500 mg BID 2) EEG--reading pending.    Felicie MornDavid Smith PA-C [facility] [phone]  04/11/2015, 10:37 AM   I personally participated in this patient's evaluation and management, including formulating above clinical impression and management recommendations.  Venetia MaxonR Libby Goehring M.D. [facility] [phone]

## 2015-04-11 NOTE — Progress Notes (Signed)
UR Completed Jaidyn Kuhl Graves-Bigelow, RN,BSN [phone]  

## 2015-04-12 ENCOUNTER — Inpatient Hospital Stay (HOSPITAL_COMMUNITY): Payer: Medicare HMO

## 2015-04-12 DIAGNOSIS — R55 Syncope and collapse: Secondary | ICD-10-CM

## 2015-04-12 DIAGNOSIS — N179 Acute kidney failure, unspecified: Secondary | ICD-10-CM | POA: Insufficient documentation

## 2015-04-12 LAB — CBC
HCT: 40.5 % (ref 39.0–52.0)
HEMOGLOBIN: 13.7 g/dL (ref 13.0–17.0)
MCH: 29.7 pg (ref 26.0–34.0)
MCHC: 33.8 g/dL (ref 30.0–36.0)
MCV: 87.7 fL (ref 78.0–100.0)
Platelets: 108 10*3/uL — ABNORMAL LOW (ref 150–400)
RBC: 4.62 MIL/uL (ref 4.22–5.81)
RDW: 13.6 % (ref 11.5–15.5)
WBC: 4.6 10*3/uL (ref 4.0–10.5)

## 2015-04-12 LAB — T3, FREE: T3 FREE: 2.7 pg/mL (ref 2.0–4.4)

## 2015-04-12 LAB — BASIC METABOLIC PANEL
ANION GAP: 8 (ref 5–15)
BUN: 16 mg/dL (ref 6–20)
CHLORIDE: 104 mmol/L (ref 101–111)
CO2: 26 mmol/L (ref 22–32)
Calcium: 9 mg/dL (ref 8.9–10.3)
Creatinine, Ser: 1.32 mg/dL — ABNORMAL HIGH (ref 0.61–1.24)
GFR calc Af Amer: 58 mL/min — ABNORMAL LOW (ref >60)
GFR, EST NON AFRICAN AMERICAN: 50 mL/min — AB (ref >60)
GLUCOSE: 93 mg/dL (ref 65–99)
Potassium: 4.2 mmol/L (ref 3.5–5.1)
Sodium: 138 mmol/L (ref 135–145)

## 2015-04-12 LAB — URINE CULTURE
Colony Count: NO GROWTH
Culture: NO GROWTH

## 2015-04-12 MED ORDER — TETRACAINE HCL 0.5 % OP SOLN
1.0000 [drp] | Freq: Once | OPHTHALMIC | Status: DC
  Filled 2015-04-12: qty 2

## 2015-04-12 NOTE — Progress Notes (Signed)
VASCULAR LAB PRELIMINARY  PRELIMINARY  PRELIMINARY  PRELIMINARY  Carotid duplex  completed.    Preliminary report:  Bilateral:  1-39% ICA stenosis.  Vertebral artery flow is antegrade.      Kaislyn Gulas, RVT 04/12/2015, 12:51 PM

## 2015-04-12 NOTE — Progress Notes (Signed)
Subjective: Awake and no further episodes while in hospital.   Objective: Current vital signs: BP 112/57 mmHg  Pulse 61  Temp(Src) 98.3 F (36.8 C) (Oral)  Resp 20  Ht  (1.727 m)  Wt 79.561 kg (175 lb 6.4 oz)  BMI 26.68 kg/m2  SpO2 95% Vital signs in last 24 hours: Temp:  [98 F (36.7 C)-98.8 F (37.1 C)] 98.3 F (36.8 C) (06/03 0400) Pulse Rate:  [59-68] 61 (06/03 0400) Resp:  [13-27] 20 (06/03 0400) BP: (104-132)/(57-102) 112/57 mmHg (06/03 0400) SpO2:  [89 %-100 %] 95 % (06/03 0400) Weight:  [79.561 kg (175 lb 6.4 oz)-83.054 kg (183 lb 1.6 oz)] 79.561 kg (175 lb 6.4 oz) (06/03 0400)  Intake/Output from previous day: 06/02 0701 - 06/03 0700 In: 900 [I.V.:900] Out: 635 [Urine:635] Intake/Output this shift:   Nutritional status: Diet Heart Room service appropriate?: Yes; Fluid consistency:: Thin  Neurologic Exam: Mental Status: Alert, oriented, thought content appropriate. Speech fluent without evidence of aphasia. Able to follow 3 step commands without difficulty. Cranial Nerves: II: Visual fields grossly normal in right eye, blind in left eye. Right pupil round and reactive 2mm and left pupil showing a APD.  III,IV, VI: ptosis not present, extra-ocular motions intact bilaterally V,VII: smile symmetric, facial light touch sensation normal bilaterally VIII: hearing normal bilaterally IX,X: uvula rises symmetrically XI: bilateral shoulder shrug XII: midline tongue extension without atrophy or fasciculations  Motor: Right :Upper extremity 5/5Left: Upper extremity 5/5 Lower extremity 5/5Lower extremity 5/5 Tone and bulk:normal tone throughout; no atrophy noted Sensory: Pinprick and light touch intact throughout, bilaterally Deep Tendon Reflexes:  Right: Upper Extremity Left: Upper extremity   biceps (C-5 to C-6) 2/4  biceps (C-5 to C-6) 2/4 tricep (C7) 2/4triceps (C7) 2/4 Brachioradialis (C6) 2/4Brachioradialis (C6) 2/4  Lower Extremity Lower Extremity  quadriceps (L-2 to L-4) 1/4 quadriceps (L-2 to L-4) 1/4 Achilles (S1) 1/4Achilles (S1) 1/4  Plantars: Right: downgoingLeft: downgoing  Lab Results: Basic Metabolic Panel:  Recent Labs Lab 04/09/15 2138 04/10/15 2151 04/10/15 2156 04/12/15 0344  NA 137 138 137 138  K 4.0 4.3 4.3 4.2  CL 101 100* 101 104  CO2 23  --  26 26  GLUCOSE 107* 156* 157* 93  BUN 27* 24* 21* 16  CREATININE 1.93* 1.60* 1.64* 1.32*  CALCIUM 9.2  --  9.3 9.0    Liver Function Tests:  Recent Labs Lab 04/09/15 2138 04/10/15 2156  AST 26 28  ALT 22 22  ALKPHOS 59 58  BILITOT 0.8 0.4  PROT 7.0 7.1  ALBUMIN 4.0 4.1    Recent Labs Lab 04/09/15 2138  LIPASE 24   No results for input(s): AMMONIA in the last 168 hours.  CBC:  Recent Labs Lab 04/10/15 0053 04/10/15 2151 04/10/15 2156 04/12/15 0344  WBC 8.3  --  6.9 4.6  NEUTROABS 6.4  --  4.7  --   HGB 14.2 15.3 14.6 13.7  HCT 41.1 45.0 42.7 40.5  MCV 86.9  --  87.7 87.7  PLT 122*  --  PLATELET CLUMPS NOTED ON SMEAR, UNABLE TO ESTIMATE 108*    Cardiac Enzymes:  Recent Labs Lab 04/11/15 0145 04/11/15 0733 04/11/15 1430  TROPONINI 0.03 <0.03 <0.03    Lipid Panel: No results for input(s): CHOL, TRIG, HDL, CHOLHDL, VLDL, LDLCALC in the last 168 hours.  CBG:  Recent Labs Lab 04/10/15 2141 04/11/15 0816 04/11/15 1203  GLUCAP 128* 102* 90    Microbiology: Results for orders placed or performed during the hospital  encounter of 04/10/15  Urine culture     Status: None   Collection Time: 04/10/15  9:54 PM  Result Value Ref Range Status   Specimen Description URINE, CATHETERIZED  Final   Special Requests NONE  Final   Colony  Count NO GROWTH Performed at [facility]   Final   Culture NO GROWTH Performed at [facility]   Final   Report Status 04/12/2015 FINAL  Final    Coagulation Studies:  Recent Labs  04/11/15 0145  LABPROT 14.5  INR 1.11    Imaging: Ct Head Wo Contrast  04/10/2015   CLINICAL DATA:  Patient found unresponsive. Syncopal episode yesterday with altered mental status.  EXAM: CT HEAD WITHOUT CONTRAST  CT CERVICAL SPINE WITHOUT CONTRAST  TECHNIQUE: Multidetector CT imaging of the head and cervical spine was performed following the standard protocol without intravenous contrast. Multiplanar CT image reconstructions of the cervical spine were also generated.  COMPARISON:  CT of the head on 04/09/2015 and 05/12/2014  FINDINGS: CT HEAD FINDINGS  Stable advanced small vessel ischemic disease in the periventricular white matter and moderate cortical atrophy. The brain demonstrates no evidence of hemorrhage, acute infarction, edema, mass effect, extra-axial fluid collection, hydrocephalus or mass lesion. The skull is unremarkable.  CT CERVICAL SPINE FINDINGS  The cervical spine shows normal alignment. There is no evidence of acute fracture or subluxation. No soft tissue swelling or hematoma is identified. Advanced cervical spondylosis noted at C5-6, C6-7 and C7-T1.  No bony or soft tissue lesions are seen. The visualized airway is normally patent.  IMPRESSION: 1. Stable atrophy and small vessel disease. No acute intracranial findings. 2. No evidence of cervical fracture. Advanced cervical spondylosis present.   Electronically Signed   By: Irish Lack M.D.   On: 04/10/2015 22:43   Ct Cervical Spine Wo Contrast  04/10/2015   CLINICAL DATA:  Patient found unresponsive. Syncopal episode yesterday with altered mental status.  EXAM: CT HEAD WITHOUT CONTRAST  CT CERVICAL SPINE WITHOUT CONTRAST  TECHNIQUE: Multidetector CT imaging of the head and cervical spine was performed following the standard  protocol without intravenous contrast. Multiplanar CT image reconstructions of the cervical spine were also generated.  COMPARISON:  CT of the head on 04/09/2015 and 05/12/2014  FINDINGS: CT HEAD FINDINGS  Stable advanced small vessel ischemic disease in the periventricular white matter and moderate cortical atrophy. The brain demonstrates no evidence of hemorrhage, acute infarction, edema, mass effect, extra-axial fluid collection, hydrocephalus or mass lesion. The skull is unremarkable.  CT CERVICAL SPINE FINDINGS  The cervical spine shows normal alignment. There is no evidence of acute fracture or subluxation. No soft tissue swelling or hematoma is identified. Advanced cervical spondylosis noted at C5-6, C6-7 and C7-T1.  No bony or soft tissue lesions are seen. The visualized airway is normally patent.  IMPRESSION: 1. Stable atrophy and small vessel disease. No acute intracranial findings. 2. No evidence of cervical fracture. Advanced cervical spondylosis present.   Electronically Signed   By: Irish Lack M.D.   On: 04/10/2015 22:43   Dg Chest Portable 1 View  04/10/2015   CLINICAL DATA:  Altered mental status and abdominal pain.  EXAM: PORTABLE CHEST - 1 VIEW  COMPARISON:  05/12/2012  FINDINGS: Bibasilar opacities are consistent with atelectasis versus infiltrates. Aspiration pneumonia cannot be excluded. No evidence of pulmonary edema or pleural effusion. No pneumothorax is identified. The heart size is normal. There is stable appearance to a dual-chamber pacemaker.  IMPRESSION: Bibasilar atelectasis versus  infiltrates.   Electronically Signed   By: Irish LackGlenn  Yamagata M.D.   On: 04/10/2015 22:17    Medications:  Scheduled: . amLODipine  10 mg Oral Daily  . donepezil  10 mg Oral Daily  . feeding supplement (ENSURE ENLIVE)  237 mL Oral BID BM  . heparin  5,000 Units Subcutaneous 3 times per day  . levETIRAcetam  500 mg Oral BID  . timolol  1 drop Both Eyes Daily    Assessment/Plan: 81 year old  male with recurrent syncope concerning for seizure. No further episodes. EEG unrevealing and normal. Given multiple episodes would recommend continue Keppra at current dose and have follow up with neurology as out patient.  At that time they can decide if he would need to chronic AED.   No driving, operating heavy machinery, perform activities at heights, swimming or participation in water activities until release by outpatient physician.  This has been discussed with patient.   Neurology will S/O at this time.   Felicie MornDavid Smith PA-C [facility] [phone]  04/12/2015, 10:14 AM  I personally participated in this patient's evaluation and management, including formulating above clinical impression and management recommendations.  Venetia MaxonR Talmage Teaster M.D. [facility] [phone]

## 2015-04-12 NOTE — Clinical Social Work Note (Signed)
Clinical Social Work Assessment  Patient Details  Name: Wesley [name] MRN: [id number] Date of Birth: 1934/06/27  Date of referral:  04/12/15               Reason for consult:  Discharge Planning, Facility Placement                Permission sought to share information with:  Family Supports Permission granted to share information::  Yes, Verbal Permission Granted  Name::     Antonietta JewelBarbara Kettering  Agency::     Relationship::  Wife  Contact Information:  [phone]  Housing/Transportation Living arrangements for the past 2 months:  Apartment Source of Information:  Spouse Patient Interpreter Needed:  None Criminal Activity/Legal Involvement Pertinent to Current Situation/Hospitalization:  No - Comment as needed Significant Relationships:  Spouse Lives with:  Self Do you feel safe going back to the place where you live?  No (High fall risk) Need for family participation in patient care:  Yes (Comment) (Patient's wife active in patient's care.)  Care giving concerns:  Patient's wife expressed concern regarding patient living at home alone.   Social Worker assessment / plan:  CSW informed patient's wife of PT recommendation of SNF placement. Patient's wife understanding and agreeable. Per patient's wife, patient lives alone and has no assistance at home. CSW to continue to follow and assist with discharge planning needs.  Employment status:  Retired Database administratornsurance information:  Managed Medicare PT Recommendations:  Skilled Nursing Facility Information / Referral to community resources:  Skilled Nursing Facility  Patient/Family's Response to care:  Patient's wife understanding and agreeable to CSW plan of care.  Patient/Family's Understanding of and Emotional Response to Diagnosis, Current Treatment, and Prognosis:  Patient's wife understanding and agreeable to CSW plan of care.  Emotional Assessment Appearance:  Other (Comment Required (CSW spoke with patient's  wife.) Attitude/Demeanor/Rapport:  Other (CSW spoke with patient's wife.) Affect (typically observed):  Other (CSW spoke with patient's wife.) Orientation:  Oriented to Self, Oriented to Place, Oriented to  Time Alcohol / Substance use:  Not Applicable Psych involvement (Current and /or in the community):  No (Comment) (Not appropriate on admission.)  Discharge Needs  Concerns to be addressed:  No discharge needs identified Readmission within the last 30 days:  No Current discharge risk:  None Barriers to Discharge:  No Barriers Identified   Rod MaeVaughn, Zsofia Prout S, LCSW 04/12/2015, 3:26 PM [phone]

## 2015-04-12 NOTE — Progress Notes (Signed)
Echocardiogram 2D Echocardiogram has been performed.  Wesley [name] N [name] [name] 04/12/2015, 10:26 AM

## 2015-04-12 NOTE — Progress Notes (Signed)
PROGRESS NOTE    Wesley [name] ZOX:[id number] DOB: 05/22/1934 DOA: 04/10/2015 PCP: Geraldo PitterBLAND,VEITA J, MD  Primary Cardiologist: Dr. Lance MussJayadeep Varanasi  HPI/Brief narrative 81 year old male with history of HTN, sinus node dysfunction status post [employer] PPM (placed in 2007 for syncope and cardiac cath at this time showed no significant disease), dementia, CKD 3, [facility]15 after found unresponsive at the bottom of the stairs on a stairwell at his apartment complex. As per EDP documentation, patient was completely unresponsive with GCS of 8, his pacemaker was not firing, he was hypoxic with inadequate respiratory effort, he had pulses and blood pressure but was not following commands. He required bag valve mask ventilation on Route to the hospital. He was given supplemental oxygen. In the ED, he initially continue to be less responsive and was about to be intubated but mental status started improving and patient was able to protect his airway. He had been seen in the ED on 04/09/15 for abdominal pain and vomiting according to the patient started an hour after eating a salami sandwich. He had also been found on the floor but no clear history of fall or LOC. At that time CT head, abdomen and pelvis showed no acute abnormality and patient had been discharged home.   Assessment/Plan:  Syncope versus seizures - Patient noted to be unresponsive on 6/1. He was found down on the ground on 5/31 but no clear report regarding LOC. Patient has no recollection of events. - Neurology consultation appreciated and our concern for seizures. - Follow EEG: No seizures - Started on Keppra and neurology recommends continuing until outpatient follow-up with neurology where decision can be made whether to continue or stop same. - Telemetry shows atrial paced rhythm - Pacemaker apparently interrogated in the ED showed tachyarrhythmia. Patient had pacemaker placed in 2007 for syncope. - Mental status  probably returned to baseline. - CT head and cervical spine without acute abnormalities. - UDS: Negative - Repeat 2-D echo: Normal EF. Carotid Doppler: Bilateral 1-39 percent ICA stenosis and vertebral artery flow is antegrade. - Neurology has signed off. - Cardiology follow-up appreciated-considering recent episode with concern for seizures, not a good candidate for anticoagulation. His pacemaker interrogation showed some episodes of A. fib. Cardiology does not anticipate any further cardiac evaluation.  Essential hypertension - Controlled. - Lisinopril/HCTZ switched to amlodipine due to acute kidney injury   Acute kidney injury - Likely related to GI losses from recent nausea, vomiting, dehydration and ACEI - Hold culprit medications and briefly hydrate - Follow BMP - Improving.   Possible food poisoning versus acute GE  - Patient states that his symptoms started an hour after eating a salami sandwich  - Improved/resolved.  - Regular consistency diet and monitor   Dementia  - Mental status probably has returned to baseline  Transient new onset A. Fib - Initial EKG on 04/10/15 showed A. fib with ventricular rate at 91 bpm and no acute changes. Subsequent EKGs have showed atrial paced rhythm. - Remains in atrial paced rhythm on monitor. - CHA2DS2-VASc Score: 2 for which oral anticoagulation would be recommended but since A. fib seems transient, not started unless he has recurrence.  - Troponin 2: Negative  - TSH: 1.080, free T4 0.89 and free T3: 2.7  Chronic thrombocytopenia - Stable  DVT prophylaxis: Heparin subcutaneous Code Status: Full Family Communication: Discussed with spouse on 04/12/2015. Disposition Plan: DC to SNF when bed available.   Consultants:  Neurology  cardiology  Procedures:  2-D echo 04/12/15: Study Conclusions  - Left ventricle: The cavity size was normal. Wall thickness was increased in a pattern of mild LVH. The estimated ejection fraction  was 60%. Wall motion was normal; there were no regional wall motion abnormalities. - Aortic valve: Sclerosis without stenosis. There was no significant regurgitation. - Right ventricle: The cavity size was normal. Pacer wire or catheter noted in right ventricle. Systolic function was normal.   EEG 04/11/15: Impression: this is a normal awake and asleep EEG. Please, be aware that a normal EEG does not exclude the possibility of epilepsy.   Antibiotics:  None   Subjective: Seen this morning. Denied complaints.   Objective: Filed Vitals:   04/11/15 2235 04/12/15 0006 04/12/15 0400 04/12/15 0749  BP: 128/74 104/85 112/57   Pulse: 61 60 61   Temp:  98.5 F (36.9 C) 98.3 F (36.8 C)   TempSrc:      Resp: Height:     (1.727 m)  Weight:   79.561 kg (175 lb 6.4 oz)   SpO2: 89% 97% 95%     Intake/Output Summary (Last 24 hours) at 04/12/15 1306 Last data filed at 04/12/15 0659  Gross per 24 hour  Intake    900 ml  Output    375 ml  Net    525 ml   Filed Weights   04/11/15 1210 04/12/15 0400  Weight: 83.054 kg (183 lb 1.6 oz) 79.561 kg (175 lb 6.4 oz)     Exam:  General exam: pleasant elderly male lying comfortably supine in bed. Respiratory system: Clear. No increased work of breathing. Cardiovascular system: S1 & S2 heard, RRR. No JVD, murmurs, gallops, clicks or pedal edema. telemetry: Atrial paced rhythm.  Gastrointestinal system: Abdomen is nondistended, soft and nontender. Normal bowel sounds heard. Central nervous system: Alert and oriented to person, place and partially to time . No focal neurological deficits. Extremities: Symmetric 5 x 5 power.   Data Reviewed: Basic Metabolic Panel:  Recent Labs Lab 04/09/15 2138 04/10/15 2151 04/10/15 2156 04/12/15 0344  NA 137 138 137 138  K 4.0 4.3 4.3 4.2  CL 101 100* 101 104  CO2 23  --  26 26  GLUCOSE 107* 156* 157* 93  BUN 27* 24* 21* 16  CREATININE 1.93* 1.60* 1.64* 1.32*  CALCIUM 9.2   --  9.3 9.0   Liver Function Tests:  Recent Labs Lab 04/09/15 2138 04/10/15 2156  AST 26 28  ALT 22 22  ALKPHOS 59 58  BILITOT 0.8 0.4  PROT 7.0 7.1  ALBUMIN 4.0 4.1    Recent Labs Lab 04/09/15 2138  LIPASE 24   No results for input(s): AMMONIA in the last 168 hours. CBC:  Recent Labs Lab 04/10/15 0053 04/10/15 2151 04/10/15 2156 04/12/15 0344  WBC 8.3  --  6.9 4.6  NEUTROABS 6.4  --  4.7  --   HGB 14.2 15.3 14.6 13.7  HCT 41.1 45.0 42.7 40.5  MCV 86.9  --  87.7 87.7  PLT 122*  --  PLATELET CLUMPS NOTED ON SMEAR, UNABLE TO ESTIMATE 108*   Cardiac Enzymes:  Recent Labs Lab 04/11/15 0145 04/11/15 0733 04/11/15 1430  TROPONINI 0.03 <0.03 <0.03   BNP (last 3 results) No results for input(s): PROBNP in the last 8760 hours. CBG:  Recent Labs Lab 04/10/15 2141 04/11/15 0816 04/11/15 1203  GLUCAP 128* 102* 90    Recent Results (from the past 240 hour(s))  Urine culture  Status: None   Collection Time: 04/10/15  9:54 PM  Result Value Ref Range Status   Specimen Description URINE, CATHETERIZED  Final   Special Requests NONE  Final   Colony Count NO GROWTH Performed at [facility]   Final   Culture NO GROWTH Performed at [facility]   Final   Report Status 04/12/2015 FINAL  Final           Studies: Ct Head Wo Contrast  04/10/2015   CLINICAL DATA:  Patient found unresponsive. Syncopal episode yesterday with altered mental status.  EXAM: CT HEAD WITHOUT CONTRAST  CT CERVICAL SPINE WITHOUT CONTRAST  TECHNIQUE: Multidetector CT imaging of the head and cervical spine was performed following the standard protocol without intravenous contrast. Multiplanar CT image reconstructions of the cervical spine were also generated.  COMPARISON:  CT of the head on 04/09/2015 and 05/12/2014  FINDINGS: CT HEAD FINDINGS  Stable advanced small vessel ischemic disease in the periventricular white matter and moderate cortical atrophy. The brain  demonstrates no evidence of hemorrhage, acute infarction, edema, mass effect, extra-axial fluid collection, hydrocephalus or mass lesion. The skull is unremarkable.  CT CERVICAL SPINE FINDINGS  The cervical spine shows normal alignment. There is no evidence of acute fracture or subluxation. No soft tissue swelling or hematoma is identified. Advanced cervical spondylosis noted at C5-6, C6-7 and C7-T1.  No bony or soft tissue lesions are seen. The visualized airway is normally patent.  IMPRESSION: 1. Stable atrophy and small vessel disease. No acute intracranial findings. 2. No evidence of cervical fracture. Advanced cervical spondylosis present.   Electronically Signed   By: Irish Lack M.D.   On: 04/10/2015 22:43   Ct Cervical Spine Wo Contrast  04/10/2015   CLINICAL DATA:  Patient found unresponsive. Syncopal episode yesterday with altered mental status.  EXAM: CT HEAD WITHOUT CONTRAST  CT CERVICAL SPINE WITHOUT CONTRAST  TECHNIQUE: Multidetector CT imaging of the head and cervical spine was performed following the standard protocol without intravenous contrast. Multiplanar CT image reconstructions of the cervical spine were also generated.  COMPARISON:  CT of the head on 04/09/2015 and 05/12/2014  FINDINGS: CT HEAD FINDINGS  Stable advanced small vessel ischemic disease in the periventricular white matter and moderate cortical atrophy. The brain demonstrates no evidence of hemorrhage, acute infarction, edema, mass effect, extra-axial fluid collection, hydrocephalus or mass lesion. The skull is unremarkable.  CT CERVICAL SPINE FINDINGS  The cervical spine shows normal alignment. There is no evidence of acute fracture or subluxation. No soft tissue swelling or hematoma is identified. Advanced cervical spondylosis noted at C5-6, C6-7 and C7-T1.  No bony or soft tissue lesions are seen. The visualized airway is normally patent.  IMPRESSION: 1. Stable atrophy and small vessel disease. No acute intracranial  findings. 2. No evidence of cervical fracture. Advanced cervical spondylosis present.   Electronically Signed   By: Irish Lack M.D.   On: 04/10/2015 22:43   Dg Chest Portable 1 View  04/10/2015   CLINICAL DATA:  Altered mental status and abdominal pain.  EXAM: PORTABLE CHEST - 1 VIEW  COMPARISON:  05/12/2012  FINDINGS: Bibasilar opacities are consistent with atelectasis versus infiltrates. Aspiration pneumonia cannot be excluded. No evidence of pulmonary edema or pleural effusion. No pneumothorax is identified. The heart size is normal. There is stable appearance to a dual-chamber pacemaker.  IMPRESSION: Bibasilar atelectasis versus infiltrates.   Electronically Signed   By: Irish Lack M.D.   On: 04/10/2015 22:17  Scheduled Meds: . amLODipine  10 mg Oral Daily  . donepezil  10 mg Oral Daily  . feeding supplement (ENSURE ENLIVE)  237 mL Oral BID BM  . heparin  5,000 Units Subcutaneous 3 times per day  . levETIRAcetam  500 mg Oral BID  . timolol  1 drop Both Eyes Daily   Continuous Infusions:    Principal Problem:   Syncope and collapse Active Problems:   Essential hypertension, benign   Cardiac pacemaker in situ   Sinus node dysfunction   Acute encephalopathy   Chronic kidney disease (CKD), stage III (moderate)   New onset a-fib    Time spent: 25 minutes.    Marcellus Scott, MD, FACP, FHM. [facility] Pager [phone]  If 7PM-7AM, please contact night-coverage [email or web address] Password [email or web address] 04/12/2015, 1:06 PM    LOS: 2 days

## 2015-04-12 NOTE — Clinical Social Work Placement (Signed)
   CLINICAL SOCIAL WORK PLACEMENT  NOTE  Date:  04/12/2015  Patient Details  Name: Wesley [name] MRN: [id number] Date of Birth: [birth year]  Clinical Social Work is seeking post-discharge placement for this patient at the Skilled  Nursing Facility level of care (*CSW will initial, date and re-position this form in  chart as items are completed):  Yes   Patient/family provided with [place] Clinical Social Work Department's list of facilities offering this level of care within the geographic area requested by the patient (or if unable, by the patient's family).  Yes   Patient/family informed of their freedom to choose among providers that offer the needed level of care, that participate in Medicare, Medicaid or managed care program needed by the patient, have an available bed and are willing to accept the patient.  Yes   Patient/family informed of [facility]'s ownership interest in [facility] and [facility], as well as of the fact that they are under no obligation to receive care at these facilities.  PASRR submitted to EDS on 04/12/15     PASRR number received on 04/12/15     Existing PASRR number confirmed on       FL2 transmitted to all facilities in geographic area requested by pt/family on 04/12/15     FL2 transmitted to all facilities within larger geographic area on       Patient informed that his/her managed care company has contracts with or will negotiate with certain facilities, including the following:            Patient/family informed of bed offers received.  Patient chooses bed at       Physician recommends and patient chooses bed at      Patient to be transferred to   on  .  Patient to be transferred to facility by       Patient family notified on   of transfer.  Name of family member notified:        PHYSICIAN Please sign FL2     Additional Comment:    _______________________________________________ Rod MaeVaughn, Kynleigh Artz S, LCSW 04/12/2015, 3:29  PM [phone]

## 2015-04-12 NOTE — Progress Notes (Signed)
Patient seen by partner Dr. Roseanne RenoStewart earlier in the day. I was called to the bedside regarding sudden painless vision loss in the left eye.   Patient reports this happening at approximately 8:45 pm.   After arrival, the patient had no light perception in the left eye with a dense APD. In the right eye he had intact visual fields. He had no EOMI deficits and his pupils were equal. He had 5/5 strength throughout and sensation was intact to LT. Nml FNF.   Ophthalmological exam revealed no clear embolus, no clear hyperemia or pallor.   I performed ocular digital massage, and the patient breathed into a paper bag with no improvement.   Possible etiologies include ischemic optic neuritis vs retinal artery embolus  I asked Dr. Allena KatzPatel with ophthamology for assistance and appreciate his help.  Ritta SlotMcNeill Kirkpatrick, MD [facility] [phone]  If 7pm- 7am, please page neurology on call as listed in AMION.

## 2015-04-12 NOTE — Evaluation (Signed)
Occupational Therapy Evaluation Patient Details Name: Wesley [name] MRN: [id number] DOB: Jul 19, 1934 Today's Date: 04/12/2015    History of Present Illness Patient is a 81 yo male admitted 04/10/15 with AMS.  PMH:  HTN, sinus node dysfunction, pacemaker, dementia, CKD   Clinical Impression   Pt currently needs min guard assist for safety with functional transfers and selfcare tasks.  He demonstrates decreased short term memory but unsure if this is baseline as no family available.  Feel he will benefit from acute care OT to help increase independence back to modified independent level for selfcare tasks in controlled environment.  Do feel he will need 24 hour supervision for safety based on cognition however.  Will continue to follow.     Follow Up Recommendations  Supervision/Assistance - 24 hour;SNF (If 24 hour cannot be provided)    Equipment Recommendations  Other (comment) (to be determined based on discharge plan)       Precautions / Restrictions Precautions Precautions: Fall Restrictions Weight Bearing Restrictions: No      Mobility Bed Mobility Overal bed mobility: Needs Assistance Bed Mobility: Supine to Sit;Sit to Supine     Supine to sit: Supervision Sit to supine: Supervision   General bed mobility comments: verbal cueing and increased time to initiate task  Transfers Overall transfer level: Needs assistance Equipment used: 1 person hand held assist Transfers: Sit to/from Stand;Stand Pivot Transfers Sit to Stand: Min guard              Balance Overall balance assessment: Needs assistance   Sitting balance-Leahy Scale: Good       Standing balance-Leahy Scale: Fair                              ADL Overall ADL's : Needs assistance/impaired Eating/Feeding: Independent;Sitting   Grooming: Wash/dry hands;Standing;Min guard   Upper Body Bathing: Set up;Sitting   Lower Body Bathing: Minimal assistance;Sit to/from stand   Upper Body  Dressing : Supervision/safety;Sitting   Lower Body Dressing: Min guard;Sit to/from stand   Toilet Transfer: Min guard;Comfort height toilet;Ambulation   Toileting- Clothing Manipulation and Hygiene: Sit to/from stand;Supervision/safety       Functional mobility during ADLs: Min guard General ADL Comments: Pt not feeling well reporting that he is nauseated.  Delayed initiation to verbal commands throughout session with pt demonstrating internal distraction.  Pt will need initial 24 hour supervision at discharge but unsure if this is possible as pt was alone for periods of time prior to admission.      Vision Vision Assessment?: Yes Eye Alignment: Within Functional Limits Ocular Range of Motion: Other (comment);Within Functional Limits Tracking/Visual Pursuits: Decreased smoothness of vertical tracking;Decreased smoothness of horizontal tracking   Perception Perception Perception Tested?: No   Praxis Praxis Praxis tested?: Within functional limits    Pertinent Vitals/Pain Pain Assessment: Faces Faces Pain Scale: Hurts little more Pain Location: stomach ache/pain Pain Descriptors / Indicators: Aching Pain Intervention(s): Limited activity within patient's tolerance;Monitored during session     Hand Dominance Right   Extremity/Trunk Assessment Upper Extremity Assessment Upper Extremity Assessment: Overall WFL for tasks assessed   Lower Extremity Assessment Lower Extremity Assessment: Defer to PT evaluation   Cervical / Trunk Assessment Cervical / Trunk Assessment: Normal   Communication Communication Communication: No difficulties   Cognition Arousal/Alertness: Awake/alert Behavior During Therapy: WFL for tasks assessed/performed Overall Cognitive Status: No family/caregiver present to determine baseline cognitive functioning Area of Impairment: Memory  Memory: Decreased short-term memory   Safety/Judgement: Decreased awareness of safety;Decreased awareness of  deficits   Problem Solving: Slow processing;Requires verbal cues General Comments: Pt unable to recall any words of the 3 given after 5 minute delay.               Home Living Family/patient expects to be discharged to:: Private residence Living Arrangements: Spouse/significant other (Ex-wife stays sometimes but not all the time.) Available Help at Discharge: Family (Unsure - patient reports he is alone during day) Type of Home: Apartment Home Access: Stairs to enter Entergy CorporationEntrance Stairs-Number of Steps: 3 Entrance Stairs-Rails: Right Home Layout: One level     Bathroom Shower/Tub: Runner, broadcasting/film/videoWalk-in shower         Home Equipment: None   Additional Comments: Information from patient - unsure of reliability      Prior Functioning/Environment Level of Independence: Independent        Comments: Information from patient - unsure of reliability    OT Diagnosis: Cognitive deficits;Generalized weakness;Acute pain   OT Problem List: Decreased strength;Impaired balance (sitting and/or standing);Decreased safety awareness;Decreased cognition   OT Treatment/Interventions: Self-care/ADL training;Balance training;Patient/family education;Neuromuscular education;Therapeutic activities;DME and/or AE instruction;Cognitive remediation/compensation    OT Goals(Current goals can be found in the care plan section) Acute Rehab OT Goals Patient Stated Goal: Pt wants to go home OT Goal Formulation: With patient Time For Goal Achievement: 04/26/15 Potential to Achieve Goals: Good  OT Frequency: Min 2X/week              End of Session Nurse Communication: Mobility status  Activity Tolerance: Patient limited by pain Patient left: in bed;with call bell/phone within reach;with bed alarm set   Time: 1335-1415 OT Time Calculation (min): 40 min Charges:  OT General Charges $OT Visit: 1 Procedure OT Evaluation $Initial OT Evaluation Tier I: 1 Procedure OT Treatments $Self Care/Home Management :  23-37 mins  Rashed Edler OTR/L 04/12/2015, 2:40 PM

## 2015-04-12 NOTE — Progress Notes (Addendum)
Subjective:  No further syncope, awake and alert this am.  Objective:  Vital Signs in the last 24 hours: Temp:  [98 F (36.7 C)-98.8 F (37.1 C)] 98.3 F (36.8 C) (06/03 0400) Pulse Rate:  [59-68] 61 (06/03 0400) Resp:  [7-27] 20 (06/03 0400) BP: (104-133)/(57-102) 112/57 mmHg (06/03 0400) SpO2:  [89 %-100 %] 95 % (06/03 0400) Weight:  [175 lb 6.4 oz (79.561 kg)-183 lb 1.6 oz (83.054 kg)] 175 lb 6.4 oz (79.561 kg) (06/03 0400)  Intake/Output from previous day:  Intake/Output Summary (Last 24 hours) at 04/12/15 0939 Last data filed at 04/12/15 0659  Gross per 24 hour  Intake    900 ml  Output    635 ml  Net    265 ml    Physical Exam: General appearance: alert, cooperative and no distress Lungs: clear to auscultation bilaterally Heart: regular rate and rhythm Skin: Skin color, texture, turgor normal. No rashes or lesions Neurologic: Grossly normal   Rate: 70  Rhythm: paced  Lab Results:  Recent Labs  04/10/15 2156 04/12/15 0344  WBC 6.9 4.6  HGB 14.6 13.7  PLT PLATELET CLUMPS NOTED ON SMEAR, UNABLE TO ESTIMATE 108*    Recent Labs  04/10/15 2156 04/12/15 0344  NA 137 138  K 4.3 4.2  CL 101 104  CO2 26 26  GLUCOSE 157* 93  BUN 21* 16  CREATININE 1.64* 1.32*    Recent Labs  04/11/15 0733 04/11/15 1430  TROPONINI <0.03 <0.03    Recent Labs  04/11/15 0145  INR 1.11    Scheduled Meds: . amLODipine  10 mg Oral Daily  . donepezil  10 mg Oral Daily  . feeding supplement (ENSURE ENLIVE)  237 mL Oral BID BM  . heparin  5,000 Units Subcutaneous 3 times per day  . levETIRAcetam  500 mg Oral BID  . timolol  1 drop Both Eyes Daily   Continuous Infusions:  PRN Meds:.acetaminophen, alum & mag hydroxide-simeth   Imaging: Imaging results have been reviewed  Cardiac Studies: Echo being done now   Assessment/Plan:  81 yo male with history of HTN, sinus node dysfunction s/p PPM, CKD admitted after syncopal event. He was found at the bottom of  the stairs. He has no memory of his fall. No chest pain or SOB. Neurology suspects seizures. Pacer was interrogated and he was found to have PAF but pacemaker otherwise functioning normally. Initially not felt to be a good candidate for long term anticoagulation per Dr Sanjuana KavaMcAlhaney.   Principal Problem:   Syncope and collapse Active Problems:   Essential hypertension, benign   Cardiac pacemaker in situ   Acute encephalopathy   Chronic kidney disease (CKD), stage III (moderate)   New onset a-fib   Sinus node dysfunction   PLAN: Further recommendations after echo and carotid dopplers obtained. His mental status seems clear this am, though I only spoke to him briefly as he was getting a bedside echo.   Corine ShelterLuke Latoyna Hird PA-C 04/12/2015, 9:39 AM [phone]  Patient seen, examined. Available data reviewed. Agree with findings, assessment, and plan as outlined by Corine ShelterLuke Kainon Varady, PA-C. As the patient is sitting up in bed having some coffee. He is sleepy this morning. He does answer questions appropriately. He does not recall the events when he was found at the bottom of his stairs. I have reviewed telemetry which shows an atrial paced rhythm without significant arrhythmia. The patient's 2-D echocardiogram is pending. Neurology evaluating him for possible seizures. Considering his recent episode  with concern for seizures, I agree with Dr. Clifton James that he is probably not a good candidate for anticoagulation. His pacemaker interrogation did show some episodes of atrial fibrillation. As long as his echo looks ok, I would not anticipate further cardiac evaluation in the hospital.  Tonny Bollman, M.D. 04/12/2015 10:54 AM

## 2015-04-12 NOTE — Progress Notes (Signed)
Pt c/o stomach hurting but denies nausea/cramping feeling. Unable to articulate how it feels other than 'bad'. Pt denies offer of medicines for relief, assistance to reposition, or food/drink to help calm stomach. Bowel sounds audible all quadrants, non-tender on palpation, BM reported by patient at 1330. Lunch tray appears un-touched at this time. Will continue to monitor closely.

## 2015-04-12 NOTE — Progress Notes (Signed)
Patient reported vision changes in left eye, beginning around 20:30. Originally reported as "seeing gray." Currently has no reflex for introduction of threat stimulus or pupillary light reflex on left. Not exhibiting any other new focal neurological changes.  MD notified by text page with immediate callback. Dr. Amada JupiterKirkpatrick to see patient.

## 2015-04-12 NOTE — Consult Note (Signed)
Patient was seen for evaluation and treatment of possible central retinal artery occlusion.  The patient's vision loss occurred at 8:45 and digital massage of left eye was performed at 9:30 along with increasing the patients CO2 level with breathing in a paper bag.  This was done to potentially dilate the central retinal artery and to also move along any embolic plaque.  Bed side exam was limited to direct ophthalmoscopy.  Visual acuity in right eye was unchanged.  CVF right eye were full. APD was noted OS with no light perception vision.    Anterior segment exam of both eyes was normal. Posterior segment exam revealed a cup/disc of 0.6 OU with mild copper wiring of arterioles without any evidence of intravascular plaques OU.  Additional attempt to dislodge any unseem plaque was made with an anterior chamber tap of approximately 0.15cc aqueous solution under sterile conditions, however, no improvement in visual acuity occurred.  It is recommended that sed rate and CRP be performed.    Differential diagnosis includes central retinal artery occlusion and ischemic optic neuropathy.  Wesley [name] will require follow-up with a retina specialist upon discharge to better evaluate the etiology of his vision loss.  It is, however, unlikely given the course and presentation of the vision loss that there will be any recovery of vision.

## 2015-04-12 NOTE — Care Management (Signed)
  Medicare Important Message given? YES   (If response is "NO", the following Medicare IM given date fields will be blank)   Date Medicare IM given:  04-12-15 Medicare IM given by: Graves-Bigelow, Ilijah Doucet   

## 2015-04-13 DIAGNOSIS — R569 Unspecified convulsions: Secondary | ICD-10-CM | POA: Insufficient documentation

## 2015-04-13 LAB — BASIC METABOLIC PANEL
Anion gap: 8 (ref 5–15)
BUN: 15 mg/dL (ref 6–20)
CO2: 26 mmol/L (ref 22–32)
CREATININE: 1.29 mg/dL — AB (ref 0.61–1.24)
Calcium: 9 mg/dL (ref 8.9–10.3)
Chloride: 103 mmol/L (ref 101–111)
GFR calc Af Amer: 59 mL/min — ABNORMAL LOW (ref >60)
GFR calc non Af Amer: 51 mL/min — ABNORMAL LOW (ref >60)
GLUCOSE: 169 mg/dL — AB (ref 65–99)
Potassium: 3.6 mmol/L (ref 3.5–5.1)
Sodium: 137 mmol/L (ref 135–145)

## 2015-04-13 LAB — LIPID PANEL
CHOL/HDL RATIO: 4.1 ratio
Cholesterol: 176 mg/dL (ref 0–200)
HDL: 43 mg/dL (ref >40)
LDL CALC: 122 mg/dL — AB (ref 0–99)
TRIGLYCERIDES: 55 mg/dL (ref <150)
VLDL: 11 mg/dL (ref 0–40)

## 2015-04-13 LAB — C-REACTIVE PROTEIN: CRP: <0.5 mg/dL (ref <1.0)

## 2015-04-13 LAB — SEDIMENTATION RATE: SED RATE: 4 mm/h (ref 0–16)

## 2015-04-13 MED ORDER — ENSURE ENLIVE PO LIQD: 237.0000 mL | Freq: Two times a day (BID) | ORAL | Status: DC

## 2015-04-13 MED ORDER — PRAVASTATIN SODIUM 20 MG PO TABS: 20.0000 mg | ORAL_TABLET | Freq: Every day | ORAL | Status: DC

## 2015-04-13 MED ORDER — LEVETIRACETAM 500 MG PO TABS: 500.0000 mg | ORAL_TABLET | Freq: Two times a day (BID) | ORAL | Status: DC

## 2015-04-13 MED ORDER — ASPIRIN EC 81 MG PO TBEC: 81.0000 mg | DELAYED_RELEASE_TABLET | Freq: Every day | ORAL | Status: DC

## 2015-04-13 MED ORDER — AMLODIPINE BESYLATE 10 MG PO TABS: 10.0000 mg | ORAL_TABLET | Freq: Every day | ORAL | Status: DC

## 2015-04-13 NOTE — Progress Notes (Signed)
Subjective:  Had visual loss last evening-see ophthalmology notes.  Still with visual loss this morning.  Telemetry did not show any bradycardia.  Echo shows good LV function.  No recurrent syncope, no shortness of breath or chest pain.  Objective:  Vital Signs in the last 24 hours: BP 111/67 mmHg  Pulse 74  Temp(Src) 98.7 F (37.1 C) (Axillary)  Resp 14  Ht 5\' 8"  (1.727 m)  Wt 79.153 kg (174 lb 8 oz)  BMI 26.54 kg/m2  SpO2 99%  Physical Exam: Pleasant elderly black male in no acute distress Lungs:  Clear Cardiac:  Regular rhythm, normal S1 and S2, no S3 Abdomen:  Soft, nontender, no masses Extremities:  No edema present  Intake/Output from previous day: 06/03 0701 - 06/04 0700 In: 240 [P.O.:240] Out: -   Weight Filed Weights   04/11/15 1210 04/12/15 0400 04/13/15 0451  Weight: 83.054 kg (183 lb 1.6 oz) 79.561 kg (175 lb 6.4 oz) 79.153 kg (174 lb 8 oz)    Lab Results: Basic Metabolic Panel:  Recent Labs  16/08/9605/03/16 0344 04/13/15 0331  NA 138 137  K 4.2 3.6  CL 104 103  CO2 26 26  GLUCOSE 93 169*  BUN 16 15  CREATININE 1.32* 1.29*   CBC:  Recent Labs  04/10/15 2156 04/12/15 0344  WBC 6.9 4.6  NEUTROABS 4.7  --   HGB 14.6 13.7  HCT 42.7 40.5  MCV 87.7 87.7  PLT PLATELET CLUMPS NOTED ON SMEAR, UNABLE TO ESTIMATE 108*   Cardiac Enzymes: Troponin (Point of Care Test)  Recent Labs  04/10/15 2148  TROPIPOC 0.01   Cardiac Panel (last 3 results)  Recent Labs  04/11/15 0145 04/11/15 0733 04/11/15 1430  TROPONINI 0.03 <0.03 <0.03    Telemetry: Atrial paced rhythm, no bradycardia   Assessment/Plan:  1.  syncope at this point suspect is noncardiac in etiology 2.  Recent visual loss of left eye currently in the process of being evaluated 3.  Functioning permanent pacemaker-according to old records battery approaching elective replacement in the next year pacemaker function was adequate on interrogation this admission  Recommendations:  No  further cardiovascular workup is noted at this point in time.     Darden PalmerW. Spencer Cosme Jacob, Jr.  MD [facility]  04/13/2015, 8:56 AM

## 2015-04-13 NOTE — Progress Notes (Signed)
[facility] Pt verbalized understanding of discharge instructions, all belongings secured, VSS.

## 2015-04-13 NOTE — Discharge Summary (Addendum)
Physician Discharge Summary  Wesley Cook VPX:[id number] DOB: 12-05-1933 DOA: 04/10/2015  PCP: Elyn Peers, MD  Primary Cardiologist: Dr. Larae Grooms  Admit date: 04/10/2015 Discharge date: 04/13/2015  Time spent: Greater than 30 minutes  Recommendations for Outpatient Follow-up:  1. M.D. at SNF in 5-7 days with repeat labs (CBC & BMP) 2. Recommend outpatient neurology consultation and follow-up: SNF to arrange 3. Recommend outpatient consultation with retina specialist: SNF to arrange 4. No driving, operating heavy machinery, perform activities at heights, swimming or participation in water activities until release by outpatient physician. This has been discussed with patient.   Discharge Diagnoses:  Principal Problem:   Syncope and collapse Active Problems:   Essential hypertension, benign   Cardiac pacemaker in situ   Sinus node dysfunction   Acute encephalopathy   Chronic kidney disease (CKD), stage III (moderate)   New onset a-fib   AKI (acute kidney injury)   Seizures   Discharge Condition: Improved & Stable  Diet recommendation: Heart healthy diet  Filed Weights   04/11/15 1210 04/12/15 0400 04/13/15 0451  Weight: 83.054 kg (183 lb 1.6 oz) 79.561 kg (175 lb 6.4 oz) 79.153 kg (174 lb 8 oz)    History of present illness:  81 year old male with history of HTN, sinus node dysfunction status post [employer] PPM (placed in 2007 for syncope and cardiac cath at this time showed no significant disease), dementia, CKD 3, presented to the [facility] ED on 04/10/15 after found unresponsive at the bottom of the stairs on a stairwell at his apartment complex. As per EDP documentation, patient was completely unresponsive with GCS of 8, his pacemaker was not firing, he was hypoxic with inadequate respiratory effort, he had pulses and blood pressure but was not following commands. He required bag valve mask ventilation on Route to the hospital. He was given supplemental oxygen. In the ED, he  initially continue to be less responsive and was about to be intubated but mental status started improving and patient was able to protect his airway. He had been seen in the ED on 04/09/15 for abdominal pain and vomiting according to the patient started an hour after eating a salami sandwich. He had also been found on the floor but no clear history of fall or LOC. At that time CT head, abdomen and pelvis showed no acute abnormality and patient had been discharged home.  Hospital Course:   Syncope versus possible seizures - Patient noted to be unresponsive on 6/1. He was found down on the ground on 5/31 but no clear report regarding LOC. Patient has no recollection of events. - Neurology consultation appreciated and were concerned for seizures. - Follow EEG: No seizures - Started on Keppra and neurology recommends continuing until outpatient follow-up with neurology where decision can be made whether to continue or stop same. - Telemetry shows atrial paced rhythm - Pacemaker apparently interrogated in the ED showed tachyarrhythmia. Patient had pacemaker placed in 2007 for syncope. - Mental status probably returned to baseline. - CT head and cervical spine without acute abnormalities. - UDS: Negative - Repeat 2-D echo: Normal EF. Carotid Doppler: Bilateral 1-39 percent ICA stenosis and vertebral artery flow is antegrade. - Neurology has signed off. - Cardiology follow-up appreciated-considering recent episode with concern for seizures, not a good candidate for anticoagulation. His pacemaker interrogation showed some episodes of A. fib. Cardiology does not anticipate any further cardiac evaluation. - Syncope at this point felt to be noncardiac in etiology - Will add aspirin & pravastatin due to  old strokes reported on CT head  Essential hypertension - Controlled. - Lisinopril/HCTZ switched to amlodipine due to acute kidney injury  Acute kidney injury - Likely related to GI losses from recent  nausea, vomiting, dehydration and ACEI - Hold culprit medications and briefly hydrate - Follow BMP - Continues to improve   Possible food poisoning versus acute GE  - Patient states that his symptoms started an hour after eating a salami sandwich  - Improved/resolved.  - Regular consistency diet and monitor   Dementia  - Mental status probably has returned to baseline  Transient new onset A. Fib - Initial EKG on 04/10/15 showed A. fib with ventricular rate at 91 bpm and no acute changes. Subsequent EKGs have showed atrial paced rhythm. - Remains in atrial paced rhythm on monitor. - CHA2DS2-VASc Score: 2 for which oral anticoagulation would be recommended but since A. fib seems transient, not started unless he has recurrence.  - Troponin 2: Negative  - TSH: 1.080, free T4 0.89 and free T3: 2.7  Chronic thrombocytopenia - Stable  Sudden painless visual loss left eye - Overnight 04/12/15 patient developed sudden painless vision loss in left eye. This happened at approximately 8:45 PM. Neurology was consulted and recommended ophthalmology consultation - Ophthalmology evaluated patient and suspect differential diagnosis of central retinal artery occlusion or ischemic optic neuropathy. I discussed with ophthalmologist Dr. Jalene Mullet who indicates that there is no further inpatient workup indicated at this time and he suspects that given the course and presentation of the vision loss that there may not be any recovery of vision. He recommends outpatient consultation with retina specialist. ESR and CRP were normal.  Status post pacemaker - Functioning permanent pacemaker. According to cardiology, upon review of old records battery approaching elective replacement in the next year.  Hyperlipidemia - LDL 122. Start pravastatin 20 mg daily  Consultants:  Neurology  Cardiology  Ophthalmology  Procedures:  2-D echo 04/12/15: Study Conclusions  - Left ventricle: The cavity size was  normal. Wall thickness was increased in a pattern of mild LVH. The estimated ejection fraction was 60%. Wall motion was normal; there were no regional wall motion abnormalities. - Aortic valve: Sclerosis without stenosis. There was no significant regurgitation. - Right ventricle: The cavity size was normal. Pacer wire or catheter noted in right ventricle. Systolic function was normal.   EEG 04/11/15: Impression: this is a normal awake and asleep EEG. Please, be aware that a normal EEG does not exclude the possibility of epilepsy.    Discharge Exam:  Complaints: Overnight events noted. No change in left eye blindness. No reported LOC or seizure-like activity. No nausea, vomiting or abdominal pain.  Filed Vitals:   04/12/15 2339 04/13/15 0451 04/13/15 0744 04/13/15 1136  BP: 116/68 118/69 111/67 112/64  Pulse: 60 61 74 59  Temp: 98.7 F (37.1 C) 98.5 F (36.9 C) 98.7 F (37.1 C) 98 F (36.7 C)  TempSrc: Oral Oral Axillary Oral  Resp:   14   Height:      Weight:  79.153 kg (174 lb 8 oz)    SpO2: 100% 100% 99% 99%    General exam: pleasant elderly male lying comfortably supine in bed. Respiratory system: Clear. No increased work of breathing. Cardiovascular system: S1 & S2 heard, RRR. No JVD, murmurs, gallops, clicks or pedal edema. telemetry: Atrial paced rhythm.  Gastrointestinal system: Abdomen is nondistended, soft and nontender. Normal bowel sounds heard. Central nervous system: Alert and oriented to person, place and partially to  time . Left eye complete visual loss. Pupils equally reacting to light and accommodation. No ptosis. No focal neurological deficits. Extremities: Symmetric 5 x 5 power.  Discharge Instructions      Discharge Instructions    Call MD for:  difficulty breathing, headache or visual disturbances    Complete by:  As directed      Call MD for:  extreme fatigue    Complete by:  As directed      Call MD for:  persistant dizziness or  light-headedness    Complete by:  As directed      Call MD for:  persistant nausea and vomiting    Complete by:  As directed      Call MD for:  severe uncontrolled pain    Complete by:  As directed      Call MD for:    Complete by:  As directed   Worsening visual impairment.     Diet - low sodium heart healthy    Complete by:  As directed      Driving Restrictions    Complete by:  As directed   No driving, operating heavy machinery, perform activities at heights, swimming or participation in water activities until release by outpatient physician. This has been discussed with patient.      Increase activity slowly    Complete by:  As directed             Medication List    STOP taking these medications        lisinopril-hydrochlorothiazide 20-12.5 MG per tablet  Commonly known as:  PRINZIDE,ZESTORETIC      TAKE these medications        acetaminophen 500 MG tablet  Commonly known as:  TYLENOL  Take 500 mg by mouth every 6 (six) hours as needed for mild pain or moderate pain.     amLODipine 10 MG tablet  Commonly known as:  NORVASC  Take 1 tablet (10 mg total) by mouth daily.     aspirin EC 81 MG tablet  Take 1 tablet (81 mg total) by mouth daily.     donepezil 10 MG tablet  Commonly known as:  ARICEPT  Take 10 mg by mouth daily.     feeding supplement (ENSURE ENLIVE) Liqd  Take 237 mLs by mouth 2 (two) times daily between meals.     levETIRAcetam 500 MG tablet  Commonly known as:  KEPPRA  Take 1 tablet (500 mg total) by mouth 2 (two) times daily.     pravastatin 20 MG tablet  Commonly known as:  PRAVACHOL  Take 1 tablet (20 mg total) by mouth daily.     timolol 0.5 % ophthalmic solution  Commonly known as:  TIMOPTIC  Place 1 drop into both eyes daily.          The results of significant diagnostics from this hospitalization (including imaging, microbiology, ancillary and laboratory) are listed below for reference.    Significant Diagnostic  Studies: Ct Abdomen Pelvis Wo Contrast  04/10/2015   CLINICAL DATA:  Abdominal pain and vomiting.  Lower abdominal pain.  EXAM: CT ABDOMEN AND PELVIS WITHOUT CONTRAST  TECHNIQUE: Multidetector CT imaging of the abdomen and pelvis was performed following the standard protocol without IV contrast.  COMPARISON:  Contrast-enhanced exam 05/12/2014  FINDINGS: Mild motion artifact through the lung bases. There is dependent atelectasis. Cardiac pacer leads are partially included. The heart size is normal.  Hepatic granulomas are unchanged, no evidence focal hepatic lesion  on noncontrast exam. Gallbladder is physiologically distended. There is no biliary dilatation. The unenhanced spleen, pancreas, and adrenal glands are normal.  Extrarenal pelvis configuration of the right kidney, unchanged from prior exam. No hydronephrosis or renal stone. Left kidney is normal. Vascular calcification noted of the right renal hila. No perinephric stranding.  Small hiatal hernia. Stomach is distended with ingested contrast. There are no dilated or thickened bowel loops. Small volume of colonic stool without colonic wall thickening. Mild to moderate distal colonic diverticulosis without diverticulitis. The appendix is normal. No free air, free fluid, or intra-abdominal fluid collection.  No retroperitoneal adenopathy. Abdominal aorta is normal in caliber. Moderate atherosclerosis of the abdominal aorta and its branches.  Within the pelvis the urinary bladder is physiologically distended. Prostate gland is normal in size. There is no pelvic free fluid or adenopathy.  Multilevel degenerative change throughout the spine with grade 1 anterolisthesis of L4 on L5 that appears degenerative. There are no acute or suspicious osseous abnormalities. Degenerative change of both hips and sacroiliac joints.  IMPRESSION: 1. No acute abnormality in the abdomen/pelvis. 2. Small hiatal hernia. Distal colonic diverticulosis without diverticulitis.    Electronically Signed   By: Jeb Levering M.D.   On: 04/10/2015 00:56   Ct Head Wo Contrast  04/10/2015   CLINICAL DATA:  Patient found unresponsive. Syncopal episode yesterday with altered mental status.  EXAM: CT HEAD WITHOUT CONTRAST  CT CERVICAL SPINE WITHOUT CONTRAST  TECHNIQUE: Multidetector CT imaging of the head and cervical spine was performed following the standard protocol without intravenous contrast. Multiplanar CT image reconstructions of the cervical spine were also generated.  COMPARISON:  CT of the head on 04/09/2015 and 05/12/2014  FINDINGS: CT HEAD FINDINGS  Stable advanced small vessel ischemic disease in the periventricular white matter and moderate cortical atrophy. The brain demonstrates no evidence of hemorrhage, acute infarction, edema, mass effect, extra-axial fluid collection, hydrocephalus or mass lesion. The skull is unremarkable.  CT CERVICAL SPINE FINDINGS  The cervical spine shows normal alignment. There is no evidence of acute fracture or subluxation. No soft tissue swelling or hematoma is identified. Advanced cervical spondylosis noted at C5-6, C6-7 and C7-T1.  No bony or soft tissue lesions are seen. The visualized airway is normally patent.  IMPRESSION: 1. Stable atrophy and small vessel disease. No acute intracranial findings. 2. No evidence of cervical fracture. Advanced cervical spondylosis present.   Electronically Signed   By: Aletta Edouard M.D.   On: 04/10/2015 22:43   Ct Head Wo Contrast  04/10/2015   CLINICAL DATA:  Acute onset of vomiting. Lying on floor. Altered mental status. Initial encounter.  EXAM: CT HEAD WITHOUT CONTRAST  TECHNIQUE: Contiguous axial images were obtained from the base of the skull through the vertex without intravenous contrast.  COMPARISON:  CT of the head performed 05/12/2014  FINDINGS: There is no evidence of acute infarction, mass lesion, or intra- or extra-axial hemorrhage on CT.  Prominence of the ventricles and sulci reflects mild  cortical volume loss. Scattered periventricular and subcortical white matter change reflects small vessel ischemic microangiopathy. Chronic lacunar infarcts are noted at the basal ganglia bilaterally.  The brainstem and fourth ventricle are within normal limits. The cerebral hemispheres demonstrate grossly normal gray-white differentiation. No mass effect or midline shift is seen.  There is no evidence of fracture; visualized osseous structures are unremarkable in appearance. The orbits are within normal limits. Mild mucosal thickening is noted at the frontal sinuses bilaterally. The remaining paranasal sinuses and mastoid  air cells are well-aerated. No significant soft tissue abnormalities are seen.  IMPRESSION: 1. No acute intracranial pathology seen on CT. 2. Mild cortical volume loss and scattered small vessel ischemic microangiopathy. Chronic lacunar infarcts at the basal ganglia bilaterally. 3. Mild mucosal thickening at the frontal sinuses bilaterally.   Electronically Signed   By: Garald Balding M.D.   On: 04/10/2015 00:42   Ct Cervical Spine Wo Contrast  04/10/2015   CLINICAL DATA:  Patient found unresponsive. Syncopal episode yesterday with altered mental status.  EXAM: CT HEAD WITHOUT CONTRAST  CT CERVICAL SPINE WITHOUT CONTRAST  TECHNIQUE: Multidetector CT imaging of the head and cervical spine was performed following the standard protocol without intravenous contrast. Multiplanar CT image reconstructions of the cervical spine were also generated.  COMPARISON:  CT of the head on 04/09/2015 and 05/12/2014  FINDINGS: CT HEAD FINDINGS  Stable advanced small vessel ischemic disease in the periventricular white matter and moderate cortical atrophy. The brain demonstrates no evidence of hemorrhage, acute infarction, edema, mass effect, extra-axial fluid collection, hydrocephalus or mass lesion. The skull is unremarkable.  CT CERVICAL SPINE FINDINGS  The cervical spine shows normal alignment. There is no  evidence of acute fracture or subluxation. No soft tissue swelling or hematoma is identified. Advanced cervical spondylosis noted at C5-6, C6-7 and C7-T1.  No bony or soft tissue lesions are seen. The visualized airway is normally patent.  IMPRESSION: 1. Stable atrophy and small vessel disease. No acute intracranial findings. 2. No evidence of cervical fracture. Advanced cervical spondylosis present.   Electronically Signed   By: Aletta Edouard M.D.   On: 04/10/2015 22:43   Dg Chest Portable 1 View  04/10/2015   CLINICAL DATA:  Altered mental status and abdominal pain.  EXAM: PORTABLE CHEST - 1 VIEW  COMPARISON:  05/12/2012  FINDINGS: Bibasilar opacities are consistent with atelectasis versus infiltrates. Aspiration pneumonia cannot be excluded. No evidence of pulmonary edema or pleural effusion. No pneumothorax is identified. The heart size is normal. There is stable appearance to a dual-chamber pacemaker.  IMPRESSION: Bibasilar atelectasis versus infiltrates.   Electronically Signed   By: Aletta Edouard M.D.   On: 04/10/2015 22:17    Microbiology: Recent Results (from the past 240 hour(s))  Urine culture     Status: None   Collection Time: 04/10/15  9:54 PM  Result Value Ref Range Status   Specimen Description URINE, CATHETERIZED  Final   Special Requests NONE  Final   Colony Count NO GROWTH Performed at [facility]   Final   Culture NO GROWTH Performed at [facility]   Final   Report Status 04/12/2015 FINAL  Final     Labs: Basic Metabolic Panel:  Recent Labs Lab 04/09/15 2138 04/10/15 2151 04/10/15 2156 04/12/15 0344 04/13/15 0331  NA 137 138 137 138 137  K 4.0 4.3 4.3 4.2 3.6  CL 101 100* 101 104 103  CO2 23  --  $R'26 26 26  'Lq$ GLUCOSE 107* 156* 157* 93 169*  BUN 27* 24* 21* 16 15  CREATININE 1.93* 1.60* 1.64* 1.32* 1.29*  CALCIUM 9.2  --  9.3 9.0 9.0   Liver Function Tests:  Recent Labs Lab 04/09/15 2138 04/10/15 2156  AST 26 28  ALT 22 22  ALKPHOS  59 58  BILITOT 0.8 0.4  PROT 7.0 7.1  ALBUMIN 4.0 4.1    Recent Labs Lab 04/09/15 2138  LIPASE 24   No results for input(s): AMMONIA in the last 168 hours.  CBC:  Recent Labs Lab 04/10/15 0053 04/10/15 2151 04/10/15 2156 04/12/15 0344  WBC 8.3  --  6.9 4.6  NEUTROABS 6.4  --  4.7  --   HGB 14.2 15.3 14.6 13.7  HCT 41.1 45.0 42.7 40.5  MCV 86.9  --  87.7 87.7  PLT 122*  --  PLATELET CLUMPS NOTED ON SMEAR, UNABLE TO ESTIMATE 108*   Cardiac Enzymes:  Recent Labs Lab 04/11/15 0145 04/11/15 0733 04/11/15 1430  TROPONINI 0.03 <0.03 <0.03   BNP: BNP (last 3 results) No results for input(s): BNP in the last 8760 hours.  ProBNP (last 3 results) No results for input(s): PROBNP in the last 8760 hours.  CBG:  Recent Labs Lab 04/10/15 2141 04/11/15 0816 04/11/15 1203  GLUCAP 128* 102* 90      Signed:  Vernell Leep, MD, FACP, FHM. [facility] Pager [phone]  If 7PM-7AM, please contact night-coverage [email or web address] Password [email or web address] 04/13/2015, 12:41 PM

## 2015-04-13 NOTE — Clinical Social Work Placement (Signed)
   CLINICAL SOCIAL WORK PLACEMENT  NOTE  Date:  04/13/2015  Patient Details  Name: Wesley Cook MRN: [id number] Date of Birth: August 30, 1934  Clinical Social Work is seeking post-discharge placement for this patient at the Skilled  Nursing Facility level of care (*CSW will initial, date and re-position this form in  chart as items are completed):  Yes   Patient/family provided with [facility] Clinical Social Work Department's list of facilities offering this level of care within the geographic area requested by the patient (or if unable, by the patient's family).  Yes   Patient/family informed of their freedom to choose among providers that offer the needed level of care, that participate in Medicare, Medicaid or managed care program needed by the patient, have an available bed and are willing to accept the patient.  Yes   Patient/family informed of [facility]'s ownership interest in [facility] and [facility], as well as of the fact that they are under no obligation to receive care at these facilities.  PASRR submitted to EDS on 04/12/15     PASRR number received on 04/12/15     Existing PASRR number confirmed on       FL2 transmitted to all facilities in geographic area requested by pt/family on 04/12/15     FL2 transmitted to all facilities within larger geographic area on       Patient informed that his/her managed care company has contracts with or will negotiate with certain facilities, including the following:        Yes   Patient/family informed of bed offers received.  [facility]     Physician recommends and patient chooses bed at      [facility] on 04[facility]13/15.  Patient to be transferred to facility by Ambulance     Patient family notified on 04/13/15 of transfer.  Name of family member notified:  Britta MccreedyBarbara     PHYSICIAN Please sign FL2     Additional Comment:  Per MD  patient ready for DC to [facility]. RN, patient, patient's family, and facility notified of DC. RN given number for report. DC packet on chart. Ambulance transport requested for patient. CSW signing off.   _______________________________________________ Roddie McBryant Mykaylah Ballman, MSW, [place]CSWA, Bridget HartshornLCASA [phone]

## 2015-04-15 LAB — HEMOGLOBIN A1C
Hgb A1c MFr Bld: 6.4 % — ABNORMAL HIGH (ref 4.8–5.6)
Mean Plasma Glucose: 137 mg/dL

## 2015-05-10 ENCOUNTER — Ambulatory Visit (INDEPENDENT_AMBULATORY_CARE_PROVIDER_SITE_OTHER): Payer: Medicare HMO | Admitting: *Deleted

## 2015-05-10 ENCOUNTER — Encounter: Payer: Self-pay | Admitting: Internal Medicine

## 2015-05-10 DIAGNOSIS — Z950 Presence of cardiac pacemaker: Secondary | ICD-10-CM | POA: Diagnosis not present

## 2015-05-10 DIAGNOSIS — I495 Sick sinus syndrome: Secondary | ICD-10-CM | POA: Diagnosis not present

## 2015-05-10 LAB — CUP PACEART INCLINIC DEVICE CHECK
Battery Impedance: 4436 Ohm
Battery Voltage: 2.65 V
Brady Statistic AP VP Percent: 0 %
Brady Statistic AP VS Percent: 96 %
Brady Statistic AS VP Percent: 0 %
Brady Statistic AS VS Percent: 4 %
Lead Channel Impedance Value: 436 Ohm
Lead Channel Impedance Value: 690 Ohm
Lead Channel Pacing Threshold Amplitude: 0.5 V
Lead Channel Pacing Threshold Amplitude: 0.75 V
Lead Channel Pacing Threshold Pulse Width: 0.4 ms
Lead Channel Sensing Intrinsic Amplitude: 22.4 mV
Lead Channel Setting Pacing Amplitude: 1.5 V
Lead Channel Setting Pacing Amplitude: 2 V
Lead Channel Setting Sensing Sensitivity: 5.6 mV
MDC IDC MSMT BATTERY REMAINING LONGEVITY: 8 mo
MDC IDC MSMT LEADCHNL RA SENSING INTR AMPL: 4 mV
MDC IDC MSMT LEADCHNL RV PACING THRESHOLD PULSEWIDTH: 0.4 ms
MDC IDC SESS DTM: 20160701102724
MDC IDC SET LEADCHNL RV PACING PULSEWIDTH: 0.4 ms

## 2015-05-10 NOTE — Progress Notes (Signed)
Pacemaker check in clinic. Normal device function. Thresholds, sensing, impedances consistent with previous measurements. Device programmed to maximize longevity. 1 mode switch--- <0.1%. No high ventricular rates noted. Device programmed at appropriate safety margins. Histogram distribution appropriate for patient activity level. Device programmed to optimize intrinsic conduction. Estimated longevity 68mo w/ range of <1-4937mo. ROV w/ JA 08/12/15.

## 2015-07-01 IMAGING — CT CT HEAD W/O CM
1 series · 15 of 30 positions shown, 19 images · non-contrast
Comparison: CT of the head performed 05/12/2014

CLINICAL DATA: Acute onset of vomiting. Lying on floor. Altered
mental status. Initial encounter.

EXAM:
CT HEAD WITHOUT CONTRAST
TECHNIQUE: Contiguous axial images were obtained from the base of the skull
through the vertex without intravenous contrast.

[Series 2: head 5.0 h30s · axial · 0.44mm/px · z∈[+1181,+1326]mm · 15 of 33 slices shown, 19 images]
[im 2/33  brain]
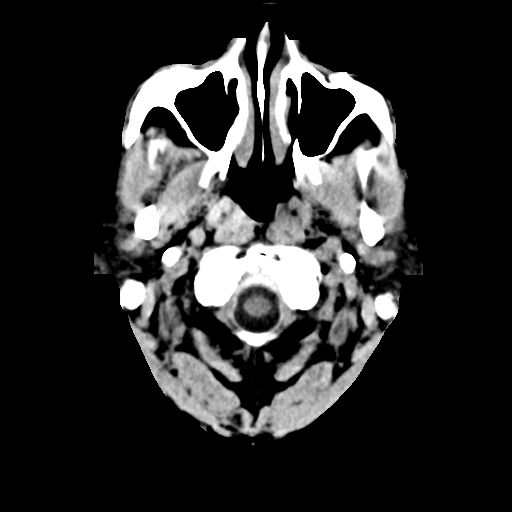
[im 2/33  bone]
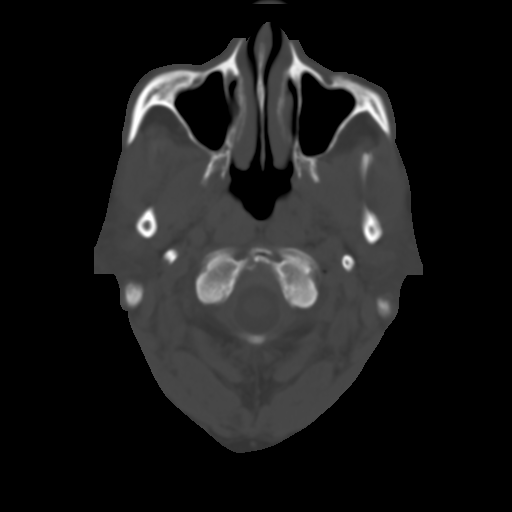
[im 4/33  brain]
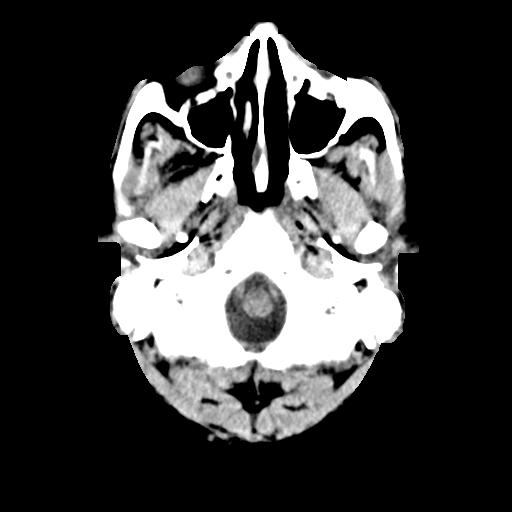
[im 6/33  brain]
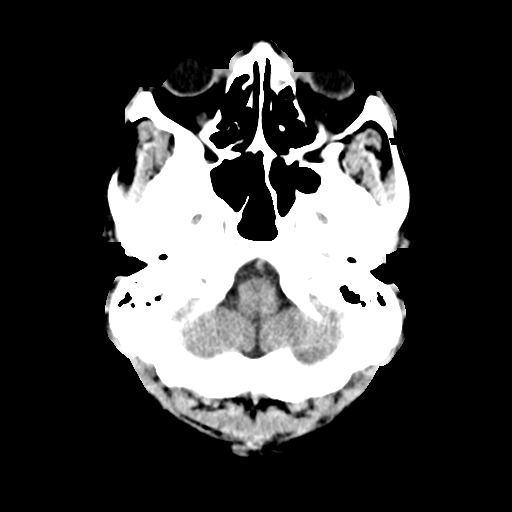
[im 8/33  brain]
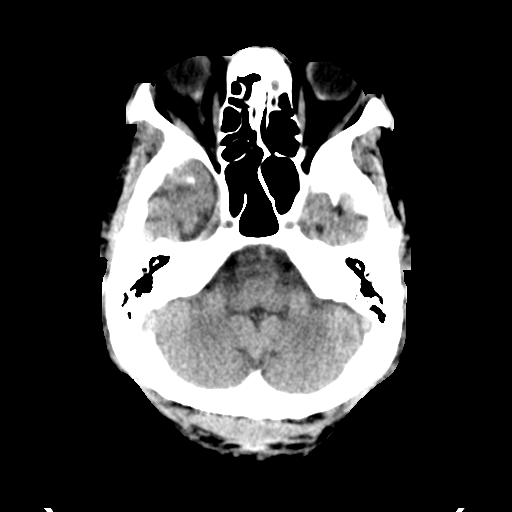
[im 10/33  brain]
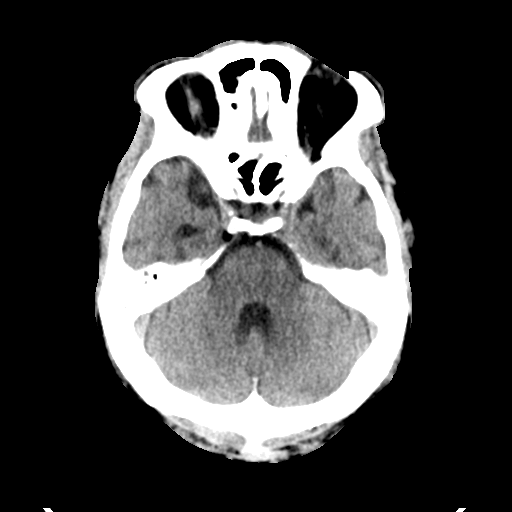
[im 10/33  bone]
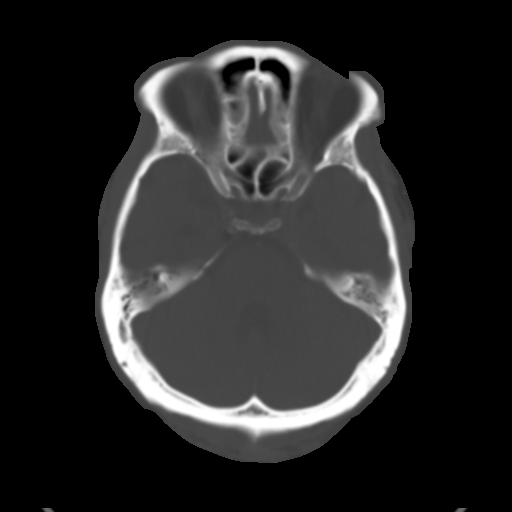
[im 13/33  brain]
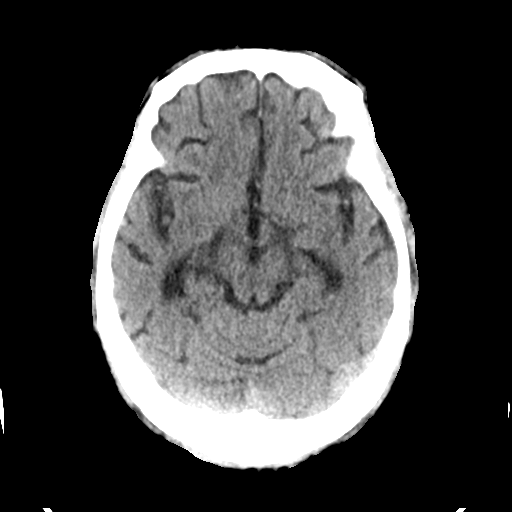
[im 15/33  brain]
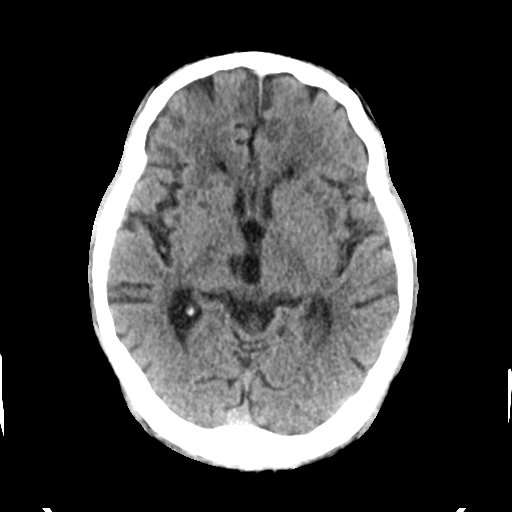
[im 17/33  brain]
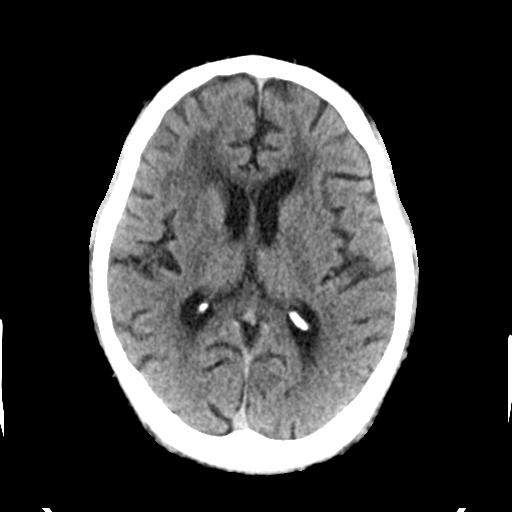
[im 18/33  brain]
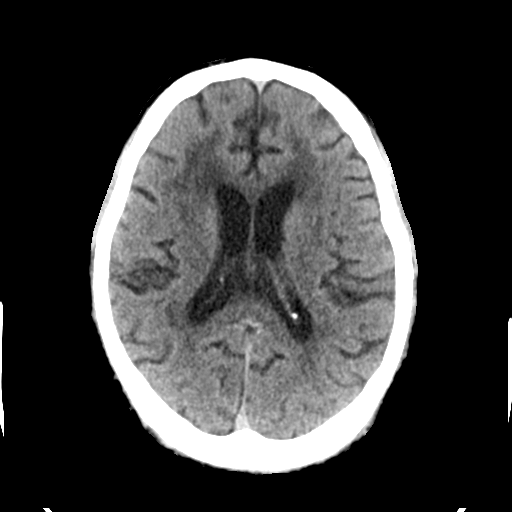
[im 18/33  bone]
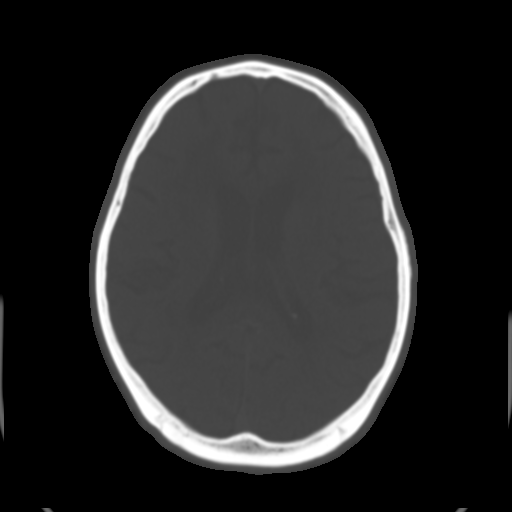
[im 20/33  brain]
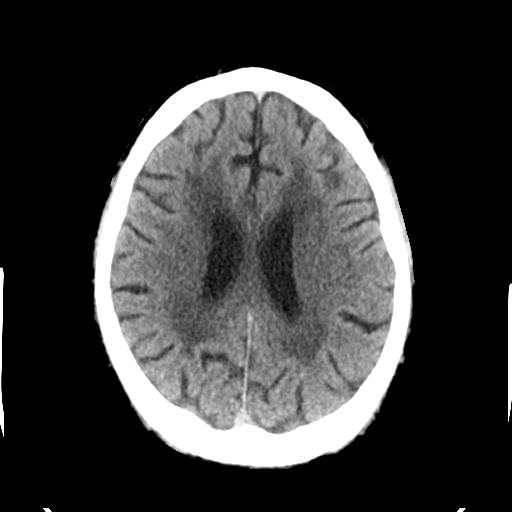
[im 23/33  brain]
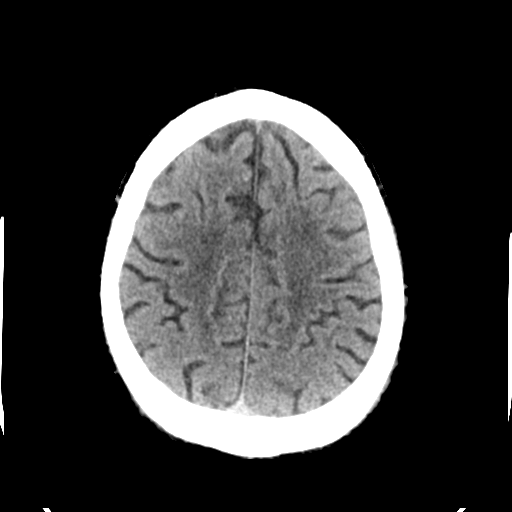
[im 25/33  brain]
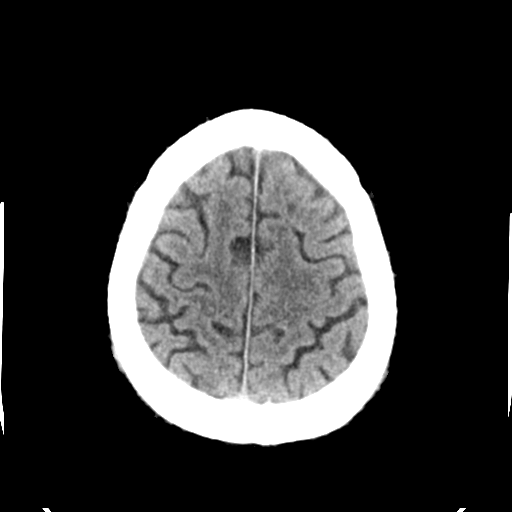
[im 27/33  brain]
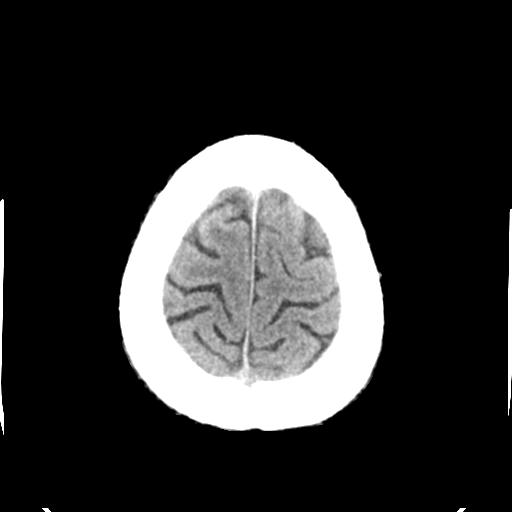
[im 27/33  bone]
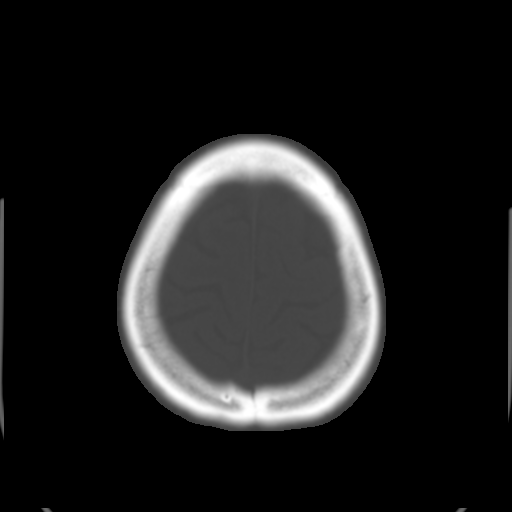
[im 29/33  brain]
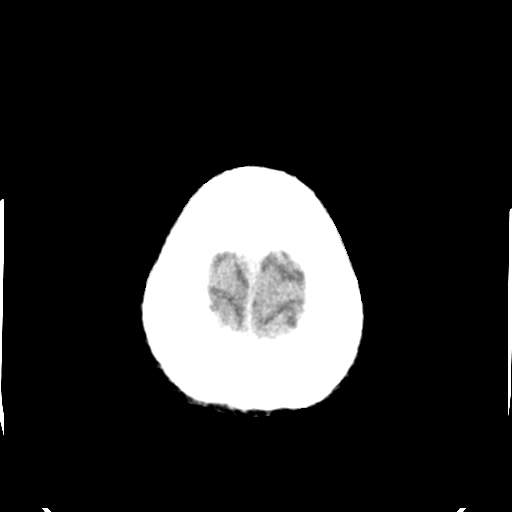
[im 31/33  brain]
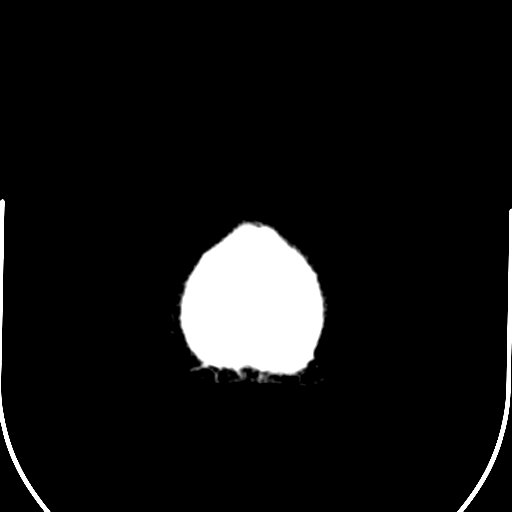

[15 of 30 positions shown; findings below may reference images not displayed]

FINDINGS: There is no evidence of acute infarction, mass lesion, or intra- or
extra-axial hemorrhage on CT.

Prominence of the ventricles and sulci reflects mild cortical volume
loss. Scattered periventricular and subcortical white matter change
reflects small vessel ischemic microangiopathy. Chronic lacunar
infarcts are noted at the basal ganglia bilaterally.

The brainstem and fourth ventricle are within normal limits. The
cerebral hemispheres demonstrate grossly normal gray-white
differentiation. No mass effect or midline shift is seen.

There is no evidence of fracture; visualized osseous structures are
unremarkable in appearance. The orbits are within normal limits.
Mild mucosal thickening is noted at the frontal sinuses bilaterally.
The remaining paranasal sinuses and mastoid air cells are
well-aerated. No significant soft tissue abnormalities are seen.
IMPRESSION: 1. No acute intracranial pathology seen on CT.
2. Mild cortical volume loss and scattered small vessel ischemic
microangiopathy. Chronic lacunar infarcts at the basal ganglia
bilaterally.
3. Mild mucosal thickening at the frontal sinuses bilaterally.

## 2015-07-02 ENCOUNTER — Emergency Department (HOSPITAL_COMMUNITY)
Admission: EM | Admit: 2015-07-02 | Discharge: 2015-07-03 | Disposition: A | Discharge status: Home / Self Care | Payer: Medicare HMO | Attending: Emergency Medicine | Admitting: Emergency Medicine

## 2015-07-02 ENCOUNTER — Encounter (HOSPITAL_COMMUNITY): Payer: Self-pay | Admitting: *Deleted

## 2015-07-02 DIAGNOSIS — Z950 Presence of cardiac pacemaker: Secondary | ICD-10-CM | POA: Diagnosis not present

## 2015-07-02 DIAGNOSIS — R109 Unspecified abdominal pain: Secondary | ICD-10-CM | POA: Diagnosis not present

## 2015-07-02 DIAGNOSIS — Z862 Personal history of diseases of the blood and blood-forming organs and certain disorders involving the immune mechanism: Secondary | ICD-10-CM | POA: Diagnosis not present

## 2015-07-02 DIAGNOSIS — Z7982 Long term (current) use of aspirin: Secondary | ICD-10-CM | POA: Diagnosis not present

## 2015-07-02 DIAGNOSIS — N183 Chronic kidney disease, stage 3 (moderate): Secondary | ICD-10-CM | POA: Insufficient documentation

## 2015-07-02 DIAGNOSIS — I129 Hypertensive chronic kidney disease with stage 1 through stage 4 chronic kidney disease, or unspecified chronic kidney disease: Secondary | ICD-10-CM | POA: Insufficient documentation

## 2015-07-02 DIAGNOSIS — Z79899 Other long term (current) drug therapy: Secondary | ICD-10-CM | POA: Insufficient documentation

## 2015-07-02 DIAGNOSIS — R1033 Periumbilical pain: Secondary | ICD-10-CM | POA: Diagnosis present

## 2015-07-02 DIAGNOSIS — F0280 Dementia in other diseases classified elsewhere without behavioral disturbance: Secondary | ICD-10-CM | POA: Insufficient documentation

## 2015-07-02 DIAGNOSIS — G309 Alzheimer's disease, unspecified: Secondary | ICD-10-CM | POA: Diagnosis not present

## 2015-07-02 HISTORY — DX: Dementia in other diseases classified elsewhere, unspecified severity, without behavioral disturbance, psychotic disturbance, mood disturbance, and anxiety: F02.80

## 2015-07-02 HISTORY — DX: Thrombocytopenia, unspecified: D69.6

## 2015-07-02 HISTORY — DX: Alzheimer's disease, unspecified: G30.9

## 2015-07-02 LAB — CBC WITH DIFFERENTIAL/PLATELET
BASOS ABS: 0 10*3/uL (ref 0.0–0.1)
Basophils Relative: 0 % (ref 0–1)
EOS ABS: 0.2 10*3/uL (ref 0.0–0.7)
EOS PCT: 3 % (ref 0–5)
HCT: 43.5 % (ref 39.0–52.0)
Hemoglobin: 14.9 g/dL (ref 13.0–17.0)
Lymphocytes Relative: 28 % (ref 12–46)
Lymphs Abs: 1.4 10*3/uL (ref 0.7–4.0)
MCH: 30.1 pg (ref 26.0–34.0)
MCHC: 34.3 g/dL (ref 30.0–36.0)
MCV: 87.9 fL (ref 78.0–100.0)
Monocytes Absolute: 0.4 10*3/uL (ref 0.1–1.0)
Monocytes Relative: 8 % (ref 3–12)
Neutro Abs: 3.1 10*3/uL (ref 1.7–7.7)
Neutrophils Relative %: 61 % (ref 43–77)
PLATELETS: 161 10*3/uL (ref 150–400)
RBC: 4.95 MIL/uL (ref 4.22–5.81)
RDW: 12.7 % (ref 11.5–15.5)
WBC: 5.1 10*3/uL (ref 4.0–10.5)

## 2015-07-02 LAB — URINALYSIS, ROUTINE W REFLEX MICROSCOPIC
BILIRUBIN URINE: NEGATIVE
GLUCOSE, UA: NEGATIVE mg/dL
Hgb urine dipstick: NEGATIVE
KETONES UR: NEGATIVE mg/dL
LEUKOCYTES UA: NEGATIVE
NITRITE: NEGATIVE
PROTEIN: NEGATIVE mg/dL
Specific Gravity, Urine: 1.024 (ref 1.005–1.030)
Urobilinogen, UA: 1 mg/dL (ref 0.0–1.0)
pH: 6 (ref 5.0–8.0)

## 2015-07-02 LAB — COMPREHENSIVE METABOLIC PANEL
ALK PHOS: 67 U/L (ref 38–126)
ALT: 31 U/L (ref 17–63)
ANION GAP: 6 (ref 5–15)
AST: 26 U/L (ref 15–41)
Albumin: 4.2 g/dL (ref 3.5–5.0)
BILIRUBIN TOTAL: 0.4 mg/dL (ref 0.3–1.2)
BUN: 17 mg/dL (ref 6–20)
CALCIUM: 9.4 mg/dL (ref 8.9–10.3)
CO2: 28 mmol/L (ref 22–32)
Chloride: 104 mmol/L (ref 101–111)
Creatinine, Ser: 1.29 mg/dL — ABNORMAL HIGH (ref 0.61–1.24)
GFR calc Af Amer: 59 mL/min — ABNORMAL LOW (ref >60)
GFR, EST NON AFRICAN AMERICAN: 51 mL/min — AB (ref >60)
Glucose, Bld: 107 mg/dL — ABNORMAL HIGH (ref 65–99)
POTASSIUM: 4 mmol/L (ref 3.5–5.1)
Sodium: 138 mmol/L (ref 135–145)
TOTAL PROTEIN: 7.3 g/dL (ref 6.5–8.1)

## 2015-07-02 IMAGING — CR DG CHEST 1V PORT
1 series · 1 of 1 positions shown · non-contrast
Comparison: 05/12/2012

CLINICAL DATA: Altered mental status and abdominal pain.

EXAM:
PORTABLE CHEST - 1 VIEW

[AP]
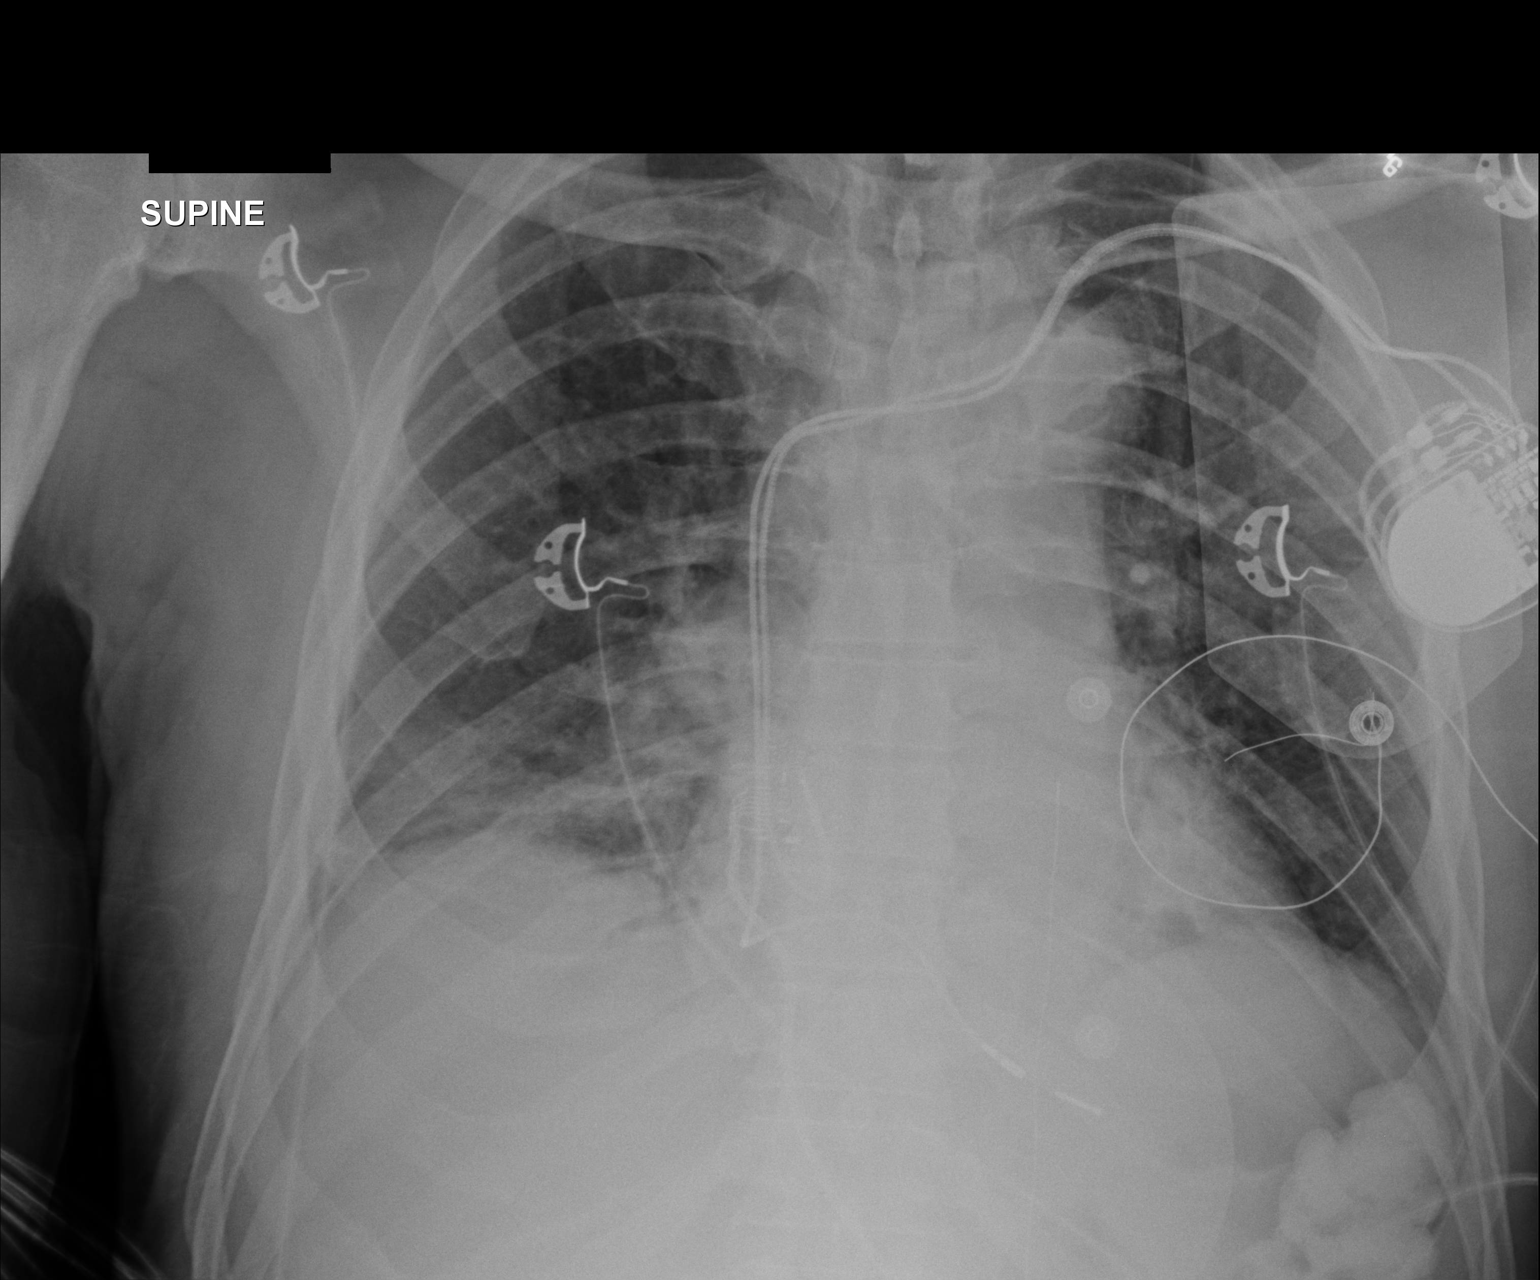

[1 of 1 positions shown; findings below may reference images not displayed]

FINDINGS: Bibasilar opacities are consistent with atelectasis versus
infiltrates. Aspiration pneumonia cannot be excluded. No evidence of
pulmonary edema or pleural effusion. No pneumothorax is identified.
The heart size is normal. There is stable appearance to a
dual-chamber pacemaker.
IMPRESSION: Bibasilar atelectasis versus infiltrates.

## 2015-07-02 NOTE — ED Notes (Signed)
Bed: ZO10 Expected date:  Expected time:  Means of arrival:  Comments: 56M upset tummy den n/v/d

## 2015-07-02 NOTE — ED Notes (Signed)
Patients came into ED today from [facility] house d/t continued c/o abdominal pain. Pt denied N/V/D. Hx Alzheimer's and Thrombocytopenia.

## 2015-07-02 NOTE — ED Provider Notes (Signed)
CSN: [id number]     Arrival date & time 07/02/15  2201 History   First MD Initiated Contact with Patient 07/02/15 2228     Chief Complaint  Patient presents with  . Abdominal Pain     (Consider location/radiation/quality/duration/timing/severity/associated sxs/prior Treatment) Patient is a 81 y.o. male presenting with abdominal pain. The history is provided by the patient. No language interpreter was used.  Abdominal Pain Pain location:  Periumbilical Pain quality: fullness   Pain radiates to:  Does not radiate Pain severity:  No pain Onset quality:  Sudden Progression:  Resolved Chronicity:  New Context: eating   Associated symptoms: no chest pain, no cough, no diarrhea, no nausea, no shortness of breath and no vomiting     Past Medical History  Diagnosis Date  . Hypertension   . Sinus node dysfunction     a. s/p MDT dual chamber pacemaker implanted by Dr Amil Amen  . CKD (chronic kidney disease), stage III   . Pacemaker   . Headache   . Alzheimer's dementia   . Thrombocytopenia    Past Surgical History  Procedure Laterality Date  . Pacemaker insertion  2007    MDT dual chamber pacemaker implanted for sinus node dysfunction by Dr Ty Hilts  . Cardiac catheterization  2007    normal coronaries   Family History  Problem Relation Age of Onset  . Cancer Father    Social History  Substance Use Topics  . Smoking status: Never Smoker   . Smokeless tobacco: Never Used  . Alcohol Use: No    Review of Systems  Respiratory: Negative for cough and shortness of breath.   Cardiovascular: Negative for chest pain.  Gastrointestinal: Positive for abdominal pain. Negative for nausea, vomiting and diarrhea.  All other systems reviewed and are negative.     Allergies  Review of patient's allergies indicates no known allergies.  Home Medications   Prior to Admission medications   Medication Sig Start Date End Date Taking? Authorizing Provider  acetaminophen (TYLENOL) 500  MG tablet Take 500 mg by mouth every 4 (four) hours as needed for mild pain, moderate pain or fever.    Yes Historical Provider, MD  alum & mag hydroxide-simeth (MAALOX/MYLANTA) 200-200-20 MG/5ML suspension Take 30 mLs by mouth every 6 (six) hours as needed for indigestion or heartburn.   Yes Historical Provider, MD  amLODipine (NORVASC) 10 MG tablet Take 1 tablet (10 mg total) by mouth daily. 04/13/15  Yes Elease Etienne, MD  aspirin EC 81 MG tablet Take 1 tablet (81 mg total) by mouth daily. 04/13/15  Yes Elease Etienne, MD  camphor-menthol [facility]) lotion Apply 1 application topically 2 (two) times daily as needed for itching.   Yes Historical Provider, MD  donepezil (ARICEPT) 10 MG tablet Take 10 mg by mouth daily.   Yes Historical Provider, MD  guaifenesin (ROBITUSSIN) 100 MG/5ML syrup Take 200 mg by mouth every 6 (six) hours as needed for cough.   Yes Historical Provider, MD  levETIRAcetam (KEPPRA) 500 MG tablet Take 1 tablet (500 mg total) by mouth 2 (two) times daily. 04/13/15  Yes Elease Etienne, MD  loperamide (IMODIUM) 2 MG capsule Take 2 mg by mouth as needed for diarrhea or loose stools.   Yes Historical Provider, MD  magnesium hydroxide (MILK OF MAGNESIA) 400 MG/5ML suspension Take 30 mLs by mouth at bedtime as needed for mild constipation.   Yes Historical Provider, MD  neomycin-bacitracin-polymyxin (NEOSPORIN) ointment Apply 1 application topically as needed for wound  care. apply to eye   Yes Historical Provider, MD  pravastatin (PRAVACHOL) 20 MG tablet Take 1 tablet (20 mg total) by mouth daily. 04/13/15  Yes Elease Etienne, MD  timolol (TIMOPTIC) 0.5 % ophthalmic solution Place 1 drop into both eyes daily.  10/31/14  Yes Historical Provider, MD  feeding supplement, ENSURE ENLIVE, (ENSURE ENLIVE) LIQD Take 237 mLs by mouth 2 (two) times daily between meals. Patient not taking: Reported on 07/02/2015 04/13/15   Elease Etienne, MD   BP 157/86 mmHg  Pulse 74  Temp(Src) 98.4 F (36.9 C)  (Oral)  Resp 18  SpO2 100% Physical Exam  Constitutional: He appears well-developed and well-nourished.  HENT:  Head: Atraumatic.  Eyes: Conjunctivae are normal.  Neck: Neck supple.  Cardiovascular: Normal rate and regular rhythm.   Pulmonary/Chest: Effort normal and breath sounds normal.  Abdominal: Soft. Bowel sounds are normal. There is no tenderness.  Musculoskeletal: He exhibits no edema or tenderness.  Neurological: He is alert.  Skin: Skin is warm and dry.  Psychiatric: He has a normal mood and affect.  Nursing note and vitals reviewed.   ED Course  Procedures (including critical care time) Labs Review Labs Reviewed  COMPREHENSIVE METABOLIC PANEL - Abnormal; Notable for the following:    Glucose, Bld 107 (*)    Creatinine, Ser 1.29 (*)    GFR calc non Af Amer 51 (*)    GFR calc Af Amer 59 (*)    All other components within normal limits  CBC WITH DIFFERENTIAL/PLATELET  URINALYSIS, ROUTINE W REFLEX MICROSCOPIC (NOT AT [facility])    Imaging Review No results found. I have personally reviewed and evaluated these images and lab results as part of my medical decision-making.   EKG Interpretation None     Patient discussed with Dr. Juleen China.  Labs reviewed. Stable creatinine and GFR. Patient remains pain free. Non-tender on exam. MDM   Final diagnoses:  None    Abdominal pain, resolved.    Felicie Morn, NP 07/03/15 1610  Raeford Razor, MD 07/03/15 1425

## 2015-07-02 NOTE — Discharge Instructions (Signed)

## 2015-07-03 NOTE — ED Notes (Signed)
Notified [facility] for transportation to [facility].

## 2015-08-12 ENCOUNTER — Encounter: Payer: Medicare HMO | Admitting: Internal Medicine

## 2015-08-23 DIAGNOSIS — H401193 Primary open-angle glaucoma, unspecified eye, severe stage: Secondary | ICD-10-CM | POA: Diagnosis not present

## 2015-09-11 ENCOUNTER — Encounter: Payer: Self-pay | Admitting: Internal Medicine

## 2015-09-11 ENCOUNTER — Ambulatory Visit (INDEPENDENT_AMBULATORY_CARE_PROVIDER_SITE_OTHER): Payer: Medicare HMO | Admitting: Internal Medicine

## 2015-09-11 VITALS — BP 138/84 | HR 61 | Ht 68.0 in | Wt 182.0 lb

## 2015-09-11 DIAGNOSIS — I10 Essential (primary) hypertension: Secondary | ICD-10-CM

## 2015-09-11 DIAGNOSIS — I495 Sick sinus syndrome: Secondary | ICD-10-CM | POA: Diagnosis not present

## 2015-09-11 LAB — BASIC METABOLIC PANEL
BUN: 14 mg/dL (ref 7–25)
CALCIUM: 9.2 mg/dL (ref 8.6–10.3)
CO2: 22 mmol/L (ref 20–31)
Chloride: 107 mmol/L (ref 98–110)
Creat: 1.33 mg/dL — ABNORMAL HIGH (ref 0.70–1.18)
Glucose, Bld: 106 mg/dL — ABNORMAL HIGH (ref 65–99)
POTASSIUM: 4.2 mmol/L (ref 3.5–5.3)
SODIUM: 141 mmol/L (ref 135–146)

## 2015-09-11 LAB — CBC WITH DIFFERENTIAL/PLATELET
BASOS ABS: 0.1 10*3/uL (ref 0.0–0.1)
BASOS PCT: 1 % (ref 0–1)
EOS PCT: 5 % (ref 0–5)
Eosinophils Absolute: 0.3 10*3/uL (ref 0.0–0.7)
HEMATOCRIT: 41 % (ref 39.0–52.0)
HEMOGLOBIN: 13.9 g/dL (ref 13.0–17.0)
LYMPHS PCT: 34 % (ref 12–46)
Lymphs Abs: 2 10*3/uL (ref 0.7–4.0)
MCH: 29.1 pg (ref 26.0–34.0)
MCHC: 33.9 g/dL (ref 30.0–36.0)
MCV: 86 fL (ref 78.0–100.0)
MONO ABS: 0.5 10*3/uL (ref 0.1–1.0)
MPV: 11.7 fL (ref 8.6–12.4)
Monocytes Relative: 9 % (ref 3–12)
NEUTROS ABS: 3.1 10*3/uL (ref 1.7–7.7)
Neutrophils Relative %: 51 % (ref 43–77)
Platelets: 146 10*3/uL — ABNORMAL LOW (ref 150–400)
RBC: 4.77 MIL/uL (ref 4.22–5.81)
RDW: 13.6 % (ref 11.5–15.5)
WBC: 6 10*3/uL (ref 4.0–10.5)

## 2015-09-11 NOTE — Patient Instructions (Signed)
Medication Instructions:  Your physician recommends that you continue on your current medications as directed. Please refer to the Current Medication list given to you today.   Labwork: Your physician recommends that you return for lab work today: BMP/CBC   Testing/Procedures: Your physician has recommended that you have a pacemaker battery replaced A pacemaker is a small device that is placed under the skin of your chest or abdomen to help control abnormal heart rhythms. This device uses electrical pulses to prompt the heart to beat at a normal rate. Pacemakers are used to treat heart rhythms that are too slow. Wire (leads) are attached to the pacemaker that goes into the chambers of you heart. This is done in the hospital and usually requires and overnight stay. Please see the instruction sheet given to you today for more information.  09/17/15--[facility] at 1:00pm Clear liquid breakfast NPO after 7:00am Wife will need to be present to sign consent  Follow-Up: Your physician recommends that you schedule a follow-up appointment in: 10-14 days from 09/17/15 in device clinic for wound check and 3 months with Dr Johney FrameAllred   Any Other Special Instructions Will Be Listed Below (If Applicable).     If you need a refill on your cardiac medications before your next appointment, please call your pharmacy.

## 2015-09-11 NOTE — Progress Notes (Signed)
Electrophysiology Office Note Date: 09/11/2015  ID:  Wesley Cook, DOB 1934-02-15, MRN [id number]  PCP: Geraldo Pitter, MD Primary Cardiologist: Eldridge Dace Electrophysiologist: Severa Jeremiah  CC: Pacemaker follow-up  Wesley Cook is a 81 y.o. male is seen today for Dr Johney Frame.  He presents today for  electrophysiology followup.  Compliance has been an issue.  Since last being seen in our clinic, the patient reports doing very well.  He denies chest pain, palpitations, dyspnea, PND, orthopnea, nausea, vomiting, dizziness, syncope, edema.  He has reached ERI and has reverted to VVI pacing but is asymptomatic.   He has advanced dementia and resides at [facility].  His wife (per caregiver today) provides consents for his procedures.  Device History: MDT dual chamber PPM implanted 2007 for sinus node dysfunction by Dr Amil Amen   Past Medical History  Diagnosis Date  . Hypertension   . Sinus node dysfunction (HCC)     a. s/p MDT dual chamber pacemaker implanted by Dr Amil Amen  . CKD (chronic kidney disease), stage III   . Pacemaker   . Headache   . Alzheimer's dementia   . Thrombocytopenia [facility])    Past Surgical History  Procedure Laterality Date  . Pacemaker insertion  2007    MDT dual chamber pacemaker implanted for sinus node dysfunction by Dr Ty Hilts  . Cardiac catheterization  2007    normal coronaries    Current Outpatient Prescriptions  Medication Sig Dispense Refill  . acetaminophen (TYLENOL) 500 MG tablet Take 500 mg by mouth every 4 (four) hours as needed for mild pain, moderate pain or fever.     Marland Kitchen alum & mag hydroxide-simeth (MAALOX/MYLANTA) 200-200-20 MG/5ML suspension Take 30 mLs by mouth every 6 (six) hours as needed for indigestion or heartburn.    Marland Kitchen amLODipine (NORVASC) 10 MG tablet Take 1 tablet (10 mg total) by mouth daily. 30 tablet 0  . aspirin EC 81 MG tablet Take 1 tablet (81 mg total) by mouth daily.    . camphor-menthol (SARNA) lotion Apply 1 application  topically 2 (two) times daily as needed for itching.    . donepezil (ARICEPT) 10 MG tablet Take 10 mg by mouth daily.    . feeding supplement, ENSURE ENLIVE, (ENSURE ENLIVE) LIQD Take 237 mLs by mouth 2 (two) times daily between meals.    Marland Kitchen guaifenesin (ROBITUSSIN) 100 MG/5ML syrup Take 200 mg by mouth every 6 (six) hours as needed for cough.    . levETIRAcetam (KEPPRA) 500 MG tablet Take 1 tablet (500 mg total) by mouth 2 (two) times daily. 60 tablet 0  . loperamide (IMODIUM) 2 MG capsule Take 2 mg by mouth daily as needed for diarrhea or loose stools.     . magnesium hydroxide (MILK OF MAGNESIA) 400 MG/5ML suspension Take 30 mLs by mouth at bedtime as needed for mild constipation.    Marland Kitchen neomycin-bacitracin-polymyxin (NEOSPORIN) ointment Apply 1 application topically as needed for wound care. apply to eye    . pravastatin (PRAVACHOL) 20 MG tablet Take 1 tablet (20 mg total) by mouth daily. 30 tablet 0  . timolol (TIMOPTIC) 0.5 % ophthalmic solution Place 1 drop into both eyes daily.   6  . travoprost, benzalkonium, (TRAVATAN) 0.004 % ophthalmic solution Place 1 drop into both eyes at bedtime.     No current facility-administered medications for this visit.    Allergies:   Review of patient's allergies indicates no known allergies.   Social History: Social History   Social History  . Marital  Status: Single    Spouse Name: N/A  . Number of Children: N/A  . Years of Education: N/A   Occupational History  . Not on file.   Social History Main Topics  . Smoking status: Never Smoker   . Smokeless tobacco: Never Used  . Alcohol Use: No  . Drug Use: No  . Sexual Activity: Not on file   Other Topics Concern  . Not on file   Social History Narrative    Family History: Family History  Problem Relation Age of Onset  . Cancer Father      Review of Systems: General: No chills, fever, night sweats or weight changes  Cardiovascular:  No chest pain, dyspnea on exertion, edema,  orthopnea, palpitations, paroxysmal nocturnal dyspnea  Dermatological: No rash, lesions or masses Respiratory: No cough, dyspnea Urologic: No hematuria, dysuria Abdominal: No nausea, vomiting, diarrhea, bright red blood per rectum, melena, or hematemesis Neurologic: No visual changes, weakness, changes in mental status All other systems reviewed and are otherwise negative except as noted above.   Physical Exam: VS:  BP 138/84 mmHg  Pulse 61  Ht 5\' 8"  (1.727 m)  Wt 182 lb (82.555 kg)  BMI 27.68 kg/m2 , BMI Body mass index is 27.68 kg/(m^2).  GEN- The patient is well appearing, alert and oriented x 3 today.   HEENT: normocephalic, atraumatic; sclera clear, conjunctiva pink; hearing intact; oropharynx clear; neck supple, no JVP Lymph- no cervical lymphadenopathy Lungs- normal work of breathing, scattered rhonchi Heart- Regular rate and rhythm, no murmurs, rubs or gallops, PMI not laterally displaced GI- soft, non-tender, non-distended, bowel sounds present, no hepatosplenomegaly Extremities- no clubbing, cyanosis, or edema; DP/PT/radial pulses 2+ bilaterally MS- no significant deformity or atrophy Skin- warm and dry, no rash or lesion; PPM pocket well healed Psych- euthymic mood, full affect Neuro- strength and sensation are intact  PPM Interrogation- reviewed in detail today,  See PACEART report   Recent Labs: 04/11/2015: TSH 1.080 07/02/2015: ALT 31; BUN 17; Creatinine, Ser 1.29*; Hemoglobin 14.9; Platelets 161; Potassium 4.0; Sodium 138   Wt Readings from Last 3 Encounters:  09/11/15 182 lb (82.555 kg)  04/13/15 174 lb 8 oz (79.153 kg)  01/30/15 178 lb (80.74 kg)     Assessment and Plan:  1.  Sympt[facility] ppm generator change. I have instructed care giver to make sure that POA is present with patient at time of procedure. Risks, benefits, and alternatives to pacemaker pulse generator replacement were discussed in detail  today.  He wishes to proceed.  We will therefore schedule the procedure at the next available time.  2.  HTN Stable No change required today   Current medicines are reviewed at length with the patient today.   The patient does not have concerns regarding his medicines.  The following changes were made today:  none     Randolm IdolSigned, Mina Carlisi MD  09/11/2015 11:39 AM  [facility] [address] [place] [address] [phone] (office) [phone] (fax

## 2015-09-12 LAB — CUP PACEART INCLINIC DEVICE CHECK
Battery Impedance: 7575 Ohm
Implantable Lead Implant Date: 20070517
Implantable Lead Location: 753859
Implantable Lead Model: 4092
Implantable Lead Model: 5076
Lead Channel Impedance Value: 631 Ohm
Lead Channel Sensing Intrinsic Amplitude: 15.67 mV
Lead Channel Setting Pacing Amplitude: 2 V
MDC IDC LEAD IMPLANT DT: 20070517
MDC IDC LEAD LOCATION: 753860
MDC IDC MSMT BATTERY VOLTAGE: 2.59 V
MDC IDC MSMT LEADCHNL RA IMPEDANCE VALUE: 67 Ohm
MDC IDC SESS DTM: 20161102142124
MDC IDC SET LEADCHNL RV PACING PULSEWIDTH: 0.4 ms
MDC IDC SET LEADCHNL RV SENSING SENSITIVITY: 5.6 mV
MDC IDC STAT BRADY RV PERCENT PACED: 0 %

## 2015-09-17 ENCOUNTER — Encounter (HOSPITAL_COMMUNITY)
Admission: RE | Disposition: A | Discharge status: Home / Self Care | Payer: Self-pay | Source: Ambulatory Visit | Attending: Internal Medicine

## 2015-09-17 ENCOUNTER — Ambulatory Visit (HOSPITAL_COMMUNITY)
Admission: RE | Admit: 2015-09-17 | Discharge: 2015-09-17 | Disposition: A | Discharge status: Home / Self Care | Payer: Medicare HMO | Source: Ambulatory Visit | Attending: Internal Medicine | Admitting: Internal Medicine

## 2015-09-17 DIAGNOSIS — N183 Chronic kidney disease, stage 3 (moderate): Secondary | ICD-10-CM | POA: Diagnosis not present

## 2015-09-17 DIAGNOSIS — G309 Alzheimer's disease, unspecified: Secondary | ICD-10-CM | POA: Insufficient documentation

## 2015-09-17 DIAGNOSIS — Z45010 Encounter for checking and testing of cardiac pacemaker pulse generator [battery]: Secondary | ICD-10-CM | POA: Diagnosis not present

## 2015-09-17 DIAGNOSIS — I10 Essential (primary) hypertension: Secondary | ICD-10-CM

## 2015-09-17 DIAGNOSIS — R69 Illness, unspecified: Secondary | ICD-10-CM | POA: Diagnosis not present

## 2015-09-17 DIAGNOSIS — I495 Sick sinus syndrome: Secondary | ICD-10-CM | POA: Diagnosis present

## 2015-09-17 DIAGNOSIS — Z7982 Long term (current) use of aspirin: Secondary | ICD-10-CM | POA: Insufficient documentation

## 2015-09-17 DIAGNOSIS — F0280 Dementia in other diseases classified elsewhere without behavioral disturbance: Secondary | ICD-10-CM | POA: Diagnosis not present

## 2015-09-17 DIAGNOSIS — I129 Hypertensive chronic kidney disease with stage 1 through stage 4 chronic kidney disease, or unspecified chronic kidney disease: Secondary | ICD-10-CM | POA: Diagnosis not present

## 2015-09-17 DIAGNOSIS — D696 Thrombocytopenia, unspecified: Secondary | ICD-10-CM | POA: Insufficient documentation

## 2015-09-17 HISTORY — PX: EP IMPLANTABLE DEVICE: SHX172B

## 2015-09-17 LAB — SURGICAL PCR SCREEN
MRSA, PCR: NEGATIVE
Staphylococcus aureus: NEGATIVE

## 2015-09-17 SURGERY — PPM/BIV PPM GENERATOR CHANGEOUT

## 2015-09-17 MED ORDER — SODIUM CHLORIDE 0.9 % IJ SOLN: 3.0000 mL | INTRAMUSCULAR | Status: DC | PRN

## 2015-09-17 MED ORDER — MIDAZOLAM HCL 5 MG/5ML IJ SOLN
INTRAMUSCULAR | Status: AC
  Filled 2015-09-17: qty 25

## 2015-09-17 MED ORDER — SODIUM CHLORIDE 0.9 % IR SOLN
80.0000 mg | Status: AC
  Administered 2015-09-17: 80 mg

## 2015-09-17 MED ORDER — ACETAMINOPHEN 325 MG PO TABS: 325.0000 mg | ORAL_TABLET | ORAL | Status: DC | PRN

## 2015-09-17 MED ORDER — CEFAZOLIN SODIUM-DEXTROSE 2-3 GM-% IV SOLR
2.0000 g | INTRAVENOUS | Status: AC
  Administered 2015-09-17: 2 g via INTRAVENOUS
  Filled 2015-09-17: qty 50

## 2015-09-17 MED ORDER — SODIUM CHLORIDE 0.9 % IR SOLN
Status: AC
  Filled 2015-09-17: qty 2

## 2015-09-17 MED ORDER — MUPIROCIN 2 % EX OINT: 1.0000 "application " | TOPICAL_OINTMENT | Freq: Once | CUTANEOUS | Status: DC

## 2015-09-17 MED ORDER — SODIUM CHLORIDE 0.9 % IV SOLN: 250.0000 mL | INTRAVENOUS | Status: DC | PRN

## 2015-09-17 MED ORDER — ONDANSETRON HCL 4 MG/2ML IJ SOLN: 4.0000 mg | Freq: Four times a day (QID) | INTRAMUSCULAR | Status: DC | PRN

## 2015-09-17 MED ORDER — LIDOCAINE HCL (PF) 1 % IJ SOLN
INTRAMUSCULAR | Status: AC
  Filled 2015-09-17: qty 30

## 2015-09-17 MED ORDER — CEFAZOLIN SODIUM-DEXTROSE 2-3 GM-% IV SOLR
INTRAVENOUS | Status: AC
  Filled 2015-09-17: qty 50

## 2015-09-17 MED ORDER — CHLORHEXIDINE GLUCONATE 4 % EX LIQD: 60.0000 mL | Freq: Once | CUTANEOUS | Status: DC

## 2015-09-17 MED ORDER — FENTANYL CITRATE (PF) 100 MCG/2ML IJ SOLN
INTRAMUSCULAR | Status: AC
  Filled 2015-09-17: qty 4

## 2015-09-17 MED ORDER — SODIUM CHLORIDE 0.9 % IJ SOLN: 3.0000 mL | Freq: Two times a day (BID) | INTRAMUSCULAR | Status: DC

## 2015-09-17 MED ORDER — SODIUM CHLORIDE 0.9 % IV SOLN
INTRAVENOUS | Status: DC
  Administered 2015-09-17: 15:00:00 via INTRAVENOUS

## 2015-09-17 MED ORDER — MUPIROCIN 2 % EX OINT
TOPICAL_OINTMENT | CUTANEOUS | Status: AC
  Administered 2015-09-17: 1
  Filled 2015-09-17: qty 22

## 2015-09-17 MED ORDER — LIDOCAINE HCL (PF) 1 % IJ SOLN
INTRAMUSCULAR | Status: DC | PRN
  Administered 2015-09-17: 42 mL

## 2015-09-17 SURGICAL SUPPLY — 5 items
CABLE SURGICAL S-101-97-12 (CABLE) ×2 IMPLANT
PACEMAKER ADAPTA DR ADDRL1 (Pacemaker) ×1 IMPLANT
PAD DEFIB LIFELINK (PAD) ×2 IMPLANT
PPM ADAPTA DR ADDRL1 (Pacemaker) ×2 IMPLANT
TRAY PACEMAKER INSERTION (PACKS) ×2 IMPLANT

## 2015-09-17 NOTE — Interval H&P Note (Signed)
History and Physical Interval Note:  09/17/2015 3:49 PM  Kathreen DevoidRaymond Koogler  has presented today for surgery, with the diagnosis of eri  The various methods of treatment have been discussed with the patient and family. After consideration of risks, benefits and other options for treatment, the patient has consented to  Procedure(s):  PPM Generator Changeout (N/A) as a surgical intervention .  The patient's history has been reviewed, patient examined, no change in status, stable for surgery.  I have reviewed the patient's chart and labs.  Questions were answered to the patient's satisfaction.     Hillis RangeJames Mackenize Delgadillo

## 2015-09-17 NOTE — H&P (View-Only) (Signed)
  Electrophysiology Office Note Date: 09/11/2015  ID:  Wesley Cook, DOB 10/14/1934, MRN [id number]  PCP: BLAND,VEITA J, MD Primary Cardiologist: Varanasi Electrophysiologist: Armonie Mettler  CC: Pacemaker follow-up  Wesley Cook is a 81 y.o. male is seen today for Dr Jule Schlabach.  He presents today for  electrophysiology followup.  Compliance has been an issue.  Since last being seen in our clinic, the patient reports doing very well.  He denies chest pain, palpitations, dyspnea, PND, orthopnea, nausea, vomiting, dizziness, syncope, edema.  He has reached ERI and has reverted to VVI pacing but is asymptomatic.   He has advanced dementia and resides at [facility].  His wife (per caregiver today) provides consents for his procedures.  Device History: MDT dual chamber PPM implanted 2007 for sinus node dysfunction by Dr Edmunds   Past Medical History  Diagnosis Date  . Hypertension   . Sinus node dysfunction (HCC)     a. s/p MDT dual chamber pacemaker implanted by Dr Edmunds  . CKD (chronic kidney disease), stage III   . Pacemaker   . Headache   . Alzheimer's dementia   . Thrombocytopenia (HCC)    Past Surgical History  Procedure Laterality Date  . Pacemaker insertion  2007    MDT dual chamber pacemaker implanted for sinus node dysfunction by Dr Edmonds  . Cardiac catheterization  2007    normal coronaries    Current Outpatient Prescriptions  Medication Sig Dispense Refill  . acetaminophen (TYLENOL) 500 MG tablet Take 500 mg by mouth every 4 (four) hours as needed for mild pain, moderate pain or fever.     . alum & mag hydroxide-simeth (MAALOX/MYLANTA) 200-200-20 MG/5ML suspension Take 30 mLs by mouth every 6 (six) hours as needed for indigestion or heartburn.    . amLODipine (NORVASC) 10 MG tablet Take 1 tablet (10 mg total) by mouth daily. 30 tablet 0  . aspirin EC 81 MG tablet Take 1 tablet (81 mg total) by mouth daily.    . camphor-menthol (SARNA) lotion Apply 1 application  topically 2 (two) times daily as needed for itching.    . donepezil (ARICEPT) 10 MG tablet Take 10 mg by mouth daily.    . feeding supplement, ENSURE ENLIVE, (ENSURE ENLIVE) LIQD Take 237 mLs by mouth 2 (two) times daily between meals.    . guaifenesin (ROBITUSSIN) 100 MG/5ML syrup Take 200 mg by mouth every 6 (six) hours as needed for cough.    . levETIRAcetam (KEPPRA) 500 MG tablet Take 1 tablet (500 mg total) by mouth 2 (two) times daily. 60 tablet 0  . loperamide (IMODIUM) 2 MG capsule Take 2 mg by mouth daily as needed for diarrhea or loose stools.     . magnesium hydroxide (MILK OF MAGNESIA) 400 MG/5ML suspension Take 30 mLs by mouth at bedtime as needed for mild constipation.    . neomycin-bacitracin-polymyxin (NEOSPORIN) ointment Apply 1 application topically as needed for wound care. apply to eye    . pravastatin (PRAVACHOL) 20 MG tablet Take 1 tablet (20 mg total) by mouth daily. 30 tablet 0  . timolol (TIMOPTIC) 0.5 % ophthalmic solution Place 1 drop into both eyes daily.   6  . travoprost, benzalkonium, (TRAVATAN) 0.004 % ophthalmic solution Place 1 drop into both eyes at bedtime.     No current facility-administered medications for this visit.    Allergies:   Review of patient's allergies indicates no known allergies.   Social History: Social History   Social History  . Marital   Status: Single    Spouse Name: N/A  . Number of Children: N/A  . Years of Education: N/A   Occupational History  . Not on file.   Social History Main Topics  . Smoking status: Never Smoker   . Smokeless tobacco: Never Used  . Alcohol Use: No  . Drug Use: No  . Sexual Activity: Not on file   Other Topics Concern  . Not on file   Social History Narrative    Family History: Family History  Problem Relation Age of Onset  . Cancer Father      Review of Systems: General: No chills, fever, night sweats or weight changes  Cardiovascular:  No chest pain, dyspnea on exertion, edema,  orthopnea, palpitations, paroxysmal nocturnal dyspnea  Dermatological: No rash, lesions or masses Respiratory: No cough, dyspnea Urologic: No hematuria, dysuria Abdominal: No nausea, vomiting, diarrhea, bright red blood per rectum, melena, or hematemesis Neurologic: No visual changes, weakness, changes in mental status All other systems reviewed and are otherwise negative except as noted above.   Physical Exam: VS:  BP 138/84 mmHg  Pulse 61  Ht 5\' 8"  (1.727 m)  Wt 182 lb (82.555 kg)  BMI 27.68 kg/m2 , BMI Body mass index is 27.68 kg/(m^2).  GEN- The patient is well appearing, alert and oriented x 3 today.   HEENT: normocephalic, atraumatic; sclera clear, conjunctiva pink; hearing intact; oropharynx clear; neck supple, no JVP Lymph- no cervical lymphadenopathy Lungs- normal work of breathing, scattered rhonchi Heart- Regular rate and rhythm, no murmurs, rubs or gallops, PMI not laterally displaced GI- soft, non-tender, non-distended, bowel sounds present, no hepatosplenomegaly Extremities- no clubbing, cyanosis, or edema; DP/PT/radial pulses 2+ bilaterally MS- no significant deformity or atrophy Skin- warm and dry, no rash or lesion; PPM pocket well healed Psych- euthymic mood, full affect Neuro- strength and sensation are intact  PPM Interrogation- reviewed in detail today,  See PACEART report   Recent Labs: 04/11/2015: TSH 1.080 07/02/2015: ALT 31; BUN 17; Creatinine, Ser 1.29*; Hemoglobin 14.9; Platelets 161; Potassium 4.0; Sodium 138   Wt Readings from Last 3 Encounters:  09/11/15 182 lb (82.555 kg)  04/13/15 174 lb 8 oz (79.153 kg)  01/30/15 178 lb (80.74 kg)     Assessment and Plan:  1.  [facility]pt[facility] require ppm generator change. I have instructed care giver to make sure that POA is present with patient at time of procedure. Risks, benefits, and alternatives to pacemaker pulse generator replacement were discussed in detail  today.  He wishes to proceed.  We will therefore schedule the procedure at the next available time.  2.  HTN Stable No change required today   Current medicines are reviewed at length with the patient today.   The patient does not have concerns regarding his medicines.  The following changes were made today:  none     Randolm IdolSigned, Shirah Roseman MD  09/11/2015 11:39 AM  [facility] [address] [place] [address] [phone] (office) [phone] (fax

## 2015-09-17 NOTE — Discharge Instructions (Signed)
Pacemaker Battery Change °A pacemaker battery usually lasts 4 to 12 years. Once or twice per year, you will be asked to visit your health care provider to have a full evaluation of your pacemaker. When a battery needs to be replaced, the entire pacemaker is replaced so that you can benefit from new circuitry and any new features that have been added to pacemakers. Most often, this procedure is very simple because the leads are already in place.  °There are many things that affect how long a pacemaker battery will last, including:  °· The age of the pacemaker.   °· The number of leads (1, 2, or 3).   °· The pacemaker work load. If the pacemaker is helping the heart more often, the battery will not last as long as it would if the pacemaker did not need to help the heart.   °· Power (voltage) settings.  °LET YOUR HEALTH CARE PROVIDER KNOW ABOUT:  °· Any allergies you have.   °· All medicines you are taking, including vitamins, herbs, eye drops, creams, and over-the-counter medicines.   °· Previous problems you or members of your family have had with the use of anesthetics.   °· Any blood disorders you have.   °· Previous surgeries you have had, especially since your last pacemaker placement.   °· Medical conditions you have.   °· Possibility of pregnancy, if this applies. °· Symptoms of chest pain, trouble breathing, palpitations, light-headedness, or feelings of an abnormal or irregular heartbeat. °RISKS AND COMPLICATIONS  °Generally, this is a safe procedure. However, as with any procedure, problems can occur and include:  °· Bleeding.   °· Bruising of the skin around where the incision was made.   °· Pain at the incision site.   °· Pulling apart of the skin at the incision site.   °· Infection.   °· Allergic reaction to anesthetics or other medicines used during the procedure.   °People with diabetes may have a temporary increase in their blood sugar after any surgical procedure.  °BEFORE THE PROCEDURE  °· Wash all  of the skin around the area of the chest where the pacemaker is located.   °· Ask your health care provider for help with any medicine adjustments before the pacemaker is replaced.   °· Do not eat or drink anything after midnight on the night before the procedure or as directed by your health care provider. °· Ask your health care provider if you can take a sip of water with any approved medicines the morning of the procedure. °PROCEDURE  °· After giving medicine to numb the skin (local anesthetic), your health care provider will make a cut to reopen the pocket holding the pacemaker.   °· The old pacemaker will be disconnected from its leads.   °· The leads will be tested.   °· If needed, the leads will be replaced. If the leads are functioning properly, the new pacemaker may be connected to the existing leads. °· A heart monitor and the pacemaker programmer will be used to make sure that the new pacemaker is working properly. °· The incision site will then be closed. A dressing will be placed over the pacemaker site. The dressing will be removed 24-48 hours afterward. °AFTER THE PROCEDURE  °· You will be taken to a recovery area after the new pacemaker implant is completed. Your vital signs such as blood pressure, heart rate, breathing, and oxygen levels will be monitored. °· Your health care provider will tell you when you will need to next test your pacemaker or when to return to the office for follow-up   for removal of stitches. °  °This information is not intended to replace advice given to you by your health care provider. Make sure you discuss any questions you have with your health care provider. °  °Document Released: 02/03/2007 Document Revised: 11/16/2014 Document Reviewed: 05/10/2013 °Elsevier Interactive Patient Education ©2016 Elsevier Inc. ° °

## 2015-09-18 ENCOUNTER — Encounter (HOSPITAL_COMMUNITY): Payer: Self-pay | Admitting: Internal Medicine

## 2015-09-30 ENCOUNTER — Ambulatory Visit (INDEPENDENT_AMBULATORY_CARE_PROVIDER_SITE_OTHER): Payer: Medicare HMO | Admitting: *Deleted

## 2015-09-30 ENCOUNTER — Encounter: Payer: Self-pay | Admitting: Internal Medicine

## 2015-09-30 DIAGNOSIS — I495 Sick sinus syndrome: Secondary | ICD-10-CM | POA: Diagnosis not present

## 2015-09-30 DIAGNOSIS — Z950 Presence of cardiac pacemaker: Secondary | ICD-10-CM | POA: Diagnosis not present

## 2015-09-30 LAB — CUP PACEART INCLINIC DEVICE CHECK
Battery Remaining Longevity: 143 mo
Battery Voltage: 2.79 V
Brady Statistic AP VP Percent: 1 %
Brady Statistic AS VP Percent: 0 %
Implantable Lead Implant Date: 20070517
Implantable Lead Location: 753859
Implantable Lead Location: 753860
Implantable Lead Model: 4092
Implantable Lead Model: 5076
Lead Channel Impedance Value: 432 Ohm
Lead Channel Pacing Threshold Amplitude: 1 V
Lead Channel Pacing Threshold Pulse Width: 0.4 ms
Lead Channel Setting Pacing Amplitude: 2.5 V
Lead Channel Setting Sensing Sensitivity: 5.6 mV
MDC IDC LEAD IMPLANT DT: 20070517
MDC IDC MSMT BATTERY IMPEDANCE: 100 Ohm
MDC IDC MSMT LEADCHNL RA SENSING INTR AMPL: 4 mV
MDC IDC MSMT LEADCHNL RV IMPEDANCE VALUE: 678 Ohm
MDC IDC MSMT LEADCHNL RV PACING THRESHOLD AMPLITUDE: 1 V
MDC IDC MSMT LEADCHNL RV PACING THRESHOLD PULSEWIDTH: 0.4 ms
MDC IDC MSMT LEADCHNL RV SENSING INTR AMPL: 22.4 mV
MDC IDC SESS DTM: 20161121145222
MDC IDC SET LEADCHNL RA PACING AMPLITUDE: 2 V
MDC IDC SET LEADCHNL RV PACING PULSEWIDTH: 0.4 ms
MDC IDC STAT BRADY AP VS PERCENT: 98 %
MDC IDC STAT BRADY AS VS PERCENT: 0 %

## 2015-09-30 NOTE — Progress Notes (Signed)
Wound check appointment. Steri-strips removed. Wound without redness or edema. Incision edges approximated, wound well healed. Normal device function. Thresholds, sensing, and impedances consistent with implant measurements. Histogram distribution appropriate for patient and level of activity. No mode switches or high ventricular rates noted. Patient educated about wound care, arm mobility, lifting restrictions. ROV with JA 12/19/15.

## 2015-10-02 DIAGNOSIS — Z23 Encounter for immunization: Secondary | ICD-10-CM | POA: Diagnosis not present

## 2015-10-22 DIAGNOSIS — E782 Mixed hyperlipidemia: Secondary | ICD-10-CM | POA: Diagnosis not present

## 2015-10-22 DIAGNOSIS — I482 Chronic atrial fibrillation: Secondary | ICD-10-CM | POA: Diagnosis not present

## 2015-10-22 DIAGNOSIS — G4089 Other seizures: Secondary | ICD-10-CM | POA: Diagnosis not present

## 2015-10-22 DIAGNOSIS — I10 Essential (primary) hypertension: Secondary | ICD-10-CM | POA: Diagnosis not present

## 2015-10-22 DIAGNOSIS — R69 Illness, unspecified: Secondary | ICD-10-CM | POA: Diagnosis not present

## 2015-11-20 DIAGNOSIS — R350 Frequency of micturition: Secondary | ICD-10-CM | POA: Diagnosis not present

## 2015-11-20 DIAGNOSIS — J309 Allergic rhinitis, unspecified: Secondary | ICD-10-CM | POA: Diagnosis not present

## 2015-12-02 DIAGNOSIS — R69 Illness, unspecified: Secondary | ICD-10-CM | POA: Diagnosis not present

## 2015-12-17 DIAGNOSIS — R32 Unspecified urinary incontinence: Secondary | ICD-10-CM | POA: Diagnosis not present

## 2015-12-19 ENCOUNTER — Encounter: Payer: Self-pay | Admitting: Internal Medicine

## 2015-12-19 ENCOUNTER — Ambulatory Visit (INDEPENDENT_AMBULATORY_CARE_PROVIDER_SITE_OTHER): Payer: Medicare HMO | Admitting: Internal Medicine

## 2015-12-19 VITALS — BP 140/84 | HR 79 | Ht 66.0 in | Wt 178.8 lb

## 2015-12-19 DIAGNOSIS — G301 Alzheimer's disease with late onset: Secondary | ICD-10-CM | POA: Diagnosis not present

## 2015-12-19 DIAGNOSIS — Z950 Presence of cardiac pacemaker: Secondary | ICD-10-CM | POA: Diagnosis not present

## 2015-12-19 DIAGNOSIS — I10 Essential (primary) hypertension: Secondary | ICD-10-CM | POA: Diagnosis not present

## 2015-12-19 DIAGNOSIS — R69 Illness, unspecified: Secondary | ICD-10-CM | POA: Diagnosis not present

## 2015-12-19 DIAGNOSIS — I495 Sick sinus syndrome: Secondary | ICD-10-CM

## 2015-12-19 LAB — CUP PACEART INCLINIC DEVICE CHECK
Battery Impedance: 100 Ohm
Battery Voltage: 2.79 V
Brady Statistic AP VP Percent: 0 %
Brady Statistic AP VS Percent: 98 %
Date Time Interrogation Session: 20170209170218
Implantable Lead Implant Date: 20070517
Implantable Lead Location: 753860
Implantable Lead Model: 4092
Lead Channel Impedance Value: 734 Ohm
Lead Channel Pacing Threshold Amplitude: 0.625 V
Lead Channel Pacing Threshold Amplitude: 1 V
Lead Channel Pacing Threshold Pulse Width: 0.4 ms
Lead Channel Pacing Threshold Pulse Width: 0.4 ms
Lead Channel Pacing Threshold Pulse Width: 0.4 ms
Lead Channel Sensing Intrinsic Amplitude: 4 mV
Lead Channel Setting Pacing Pulse Width: 0.4 ms
Lead Channel Setting Sensing Sensitivity: 5.6 mV
MDC IDC LEAD IMPLANT DT: 20070517
MDC IDC LEAD LOCATION: 753859
MDC IDC MSMT BATTERY REMAINING LONGEVITY: 142 mo
MDC IDC MSMT LEADCHNL RA IMPEDANCE VALUE: 444 Ohm
MDC IDC MSMT LEADCHNL RA PACING THRESHOLD AMPLITUDE: 0.5 V
MDC IDC MSMT LEADCHNL RV PACING THRESHOLD AMPLITUDE: 0.875 V
MDC IDC MSMT LEADCHNL RV PACING THRESHOLD PULSEWIDTH: 0.4 ms
MDC IDC MSMT LEADCHNL RV SENSING INTR AMPL: 22.4 mV
MDC IDC SET LEADCHNL RA PACING AMPLITUDE: 2 V
MDC IDC SET LEADCHNL RV PACING AMPLITUDE: 2.5 V
MDC IDC STAT BRADY AS VP PERCENT: 0 %
MDC IDC STAT BRADY AS VS PERCENT: 2 %

## 2015-12-19 NOTE — Progress Notes (Signed)
Electrophysiology Office Note Date: 12/19/2015  ID:  Wesley Cook, DOB 1933/12/25, MRN [id number]  PCP: Ron Parker, MD Primary Cardiologist: Eldridge Dace Electrophysiologist: Sladen Plancarte  CC: Pacemaker follow-up  Wesley Cook is a 82 y.o. male presents today for routine EP follow-up.  Since his recent pacemaker generator change, the patient reports doing very well.  He denies chest pain, palpitations, dyspnea, PND, orthopnea, nausea, vomiting, dizziness, syncope, edema.  He has advanced dementia and resides at [facility].    Device History: MDT dual chamber PPM implanted 2007 for sinus node dysfunction by Dr Amil Amen   Past Medical History  Diagnosis Date  . Hypertension   . Sinus node dysfunction (HCC)     a. s/p MDT dual chamber pacemaker implanted by Dr Amil Amen  . CKD (chronic kidney disease), stage III   . Pacemaker   . Headache   . Alzheimer's dementia   . Thrombocytopenia [facility])    Past Surgical History  Procedure Laterality Date  . Pacemaker insertion  2007    MDT dual chamber pacemaker implanted for sinus node dysfunction by Dr Ty Hilts  . Cardiac catheterization  2007    normal coronaries  . Ep implantable device N/A 09/17/2015    Procedure:  PPM Generator Changeout;  Surgeon: Hillis Range, MD;  Location: [facility] INVASIVE CV LAB;  Service: Cardiovascular;  Laterality: N/A;    Current Outpatient Prescriptions  Medication Sig Dispense Refill  . acetaminophen (TYLENOL) 500 MG tablet Take 500 mg by mouth every 4 (four) hours as needed for mild pain, moderate pain or fever.     Marland Kitchen alum & mag hydroxide-simeth (MAALOX/MYLANTA) 200-200-20 MG/5ML suspension Take 30 mLs by mouth every 6 (six) hours as needed for indigestion or heartburn.    Marland Kitchen amLODipine (NORVASC) 10 MG tablet Take 1 tablet (10 mg total) by mouth daily. 30 tablet 0  . aspirin EC 81 MG tablet Take 1 tablet (81 mg total) by mouth daily.    . camphor-menthol (SARNA) lotion Apply 1 application topically 2 (two) times  daily as needed for itching.    . donepezil (ARICEPT) 10 MG tablet Take 10 mg by mouth daily.    Marland Kitchen guaifenesin (ROBITUSSIN) 100 MG/5ML syrup Take 200 mg by mouth every 6 (six) hours as needed for cough.    . levETIRAcetam (KEPPRA) 500 MG tablet Take 1 tablet (500 mg total) by mouth 2 (two) times daily. 60 tablet 0  . loperamide (IMODIUM) 2 MG capsule Take 2 mg by mouth daily as needed for diarrhea or loose stools.     Marland Kitchen loratadine (CLARITIN) 10 MG tablet Take 10 mg by mouth daily as needed for allergies.    . magnesium hydroxide (MILK OF MAGNESIA) 400 MG/5ML suspension Take 30 mLs by mouth at bedtime as needed for mild constipation.    Marland Kitchen neomycin-bacitracin-polymyxin (NEOSPORIN) ointment Apply 1 application topically as needed for wound care. apply to eye    . PARoxetine (PAXIL) 10 MG tablet Take 10 mg by mouth daily.    . pravastatin (PRAVACHOL) 20 MG tablet Take 1 tablet (20 mg total) by mouth daily. 30 tablet 0  . timolol (TIMOPTIC) 0.5 % ophthalmic solution Place 1 drop into both eyes daily.   6  . Travoprost, BAK Free, (TRAVATAN) 0.004 % SOLN ophthalmic solution Place 1 drop into both eyes at bedtime.     No current facility-administered medications for this visit.    Allergies:   Review of patient's allergies indicates no known allergies.   Social History: Social History  Social History  . Marital Status: Single    Spouse Name: N/A  . Number of Children: N/A  . Years of Education: N/A   Occupational History  . Not on file.   Social History Main Topics  . Smoking status: Never Smoker   . Smokeless tobacco: Never Used  . Alcohol Use: No  . Drug Use: No  . Sexual Activity: Not on file   Other Topics Concern  . Not on file   Social History Narrative    Family History: Family History  Problem Relation Age of Onset  . Cancer Father      Review of Systems: General: No chills, fever, night sweats or weight changes  Cardiovascular:  No chest pain, dyspnea on  exertion, edema, orthopnea, palpitations, paroxysmal nocturnal dyspnea  Dermatological: No rash, lesions or masses Respiratory: No cough, dyspnea Urologic: No hematuria, dysuria Abdominal: No nausea, vomiting, diarrhea, bright red blood per rectum, melena, or hematemesis Neurologic: No visual changes, weakness, changes in mental status All other systems reviewed and are otherwise negative except as noted above.   Physical Exam: VS:  BP 140/84 mmHg  Pulse 79  Ht  (1.676 m)  Wt 178 lb 12.8 oz (81.103 kg)  BMI 28.87 kg/m2 , BMI Body mass index is 28.87 kg/(m^2).  GEN- The patient is well appearing, alert and oriented x 3 today.   HEENT: normocephalic, atraumatic; sclera clear, conjunctiva pink; hearing intact; oropharynx clear; neck supple, no JVP Lymph- no cervical lymphadenopathy Lungs- normal work of breathing, scattered rhonchi Heart- Regular rate and rhythm, no murmurs, rubs or gallops, PMI not laterally displaced GI- soft, non-tender, non-distended, bowel sounds present, no hepatosplenomegaly Extremities- no clubbing, cyanosis, or edema; DP/PT/radial pulses 2+ bilaterally MS- no significant deformity or atrophy Skin- warm and dry, no rash or lesion; PPM pocket well healed Psych- euthymic mood, full affect Neuro- strength and sensation are intact  PPM Interrogation- reviewed in detail today,  See PACEART report   Recent Labs: 04/11/2015: TSH 1.080 07/02/2015: ALT 31 09/11/2015: BUN 14; Creat 1.33*; Hemoglobin 13.9; Platelets 146*; Potassium 4.2; Sodium 141   Wt Readings from Last 3 Encounters:  12/19/15 178 lb 12.8 oz (81.103 kg)  09/11/15 182 lb (82.555 kg)  04/13/15 174 lb 8 oz (79.153 kg)     Assessment and Plan:  1.  Symptomatic sinus node dysfunction Normal pacemaker function See Pace Art report No changes today  2.  HTN Stable No change required today   Current medicines are reviewed at length with the patient today.   The patient does not have concerns  regarding his medicines.  The following changes were made today:  none   [facility] Return to see EP NP in 1 year Follow-up with Dr Eldridge Dace as scheduled   Signed, Hillis Range MD  12/19/2015 2:03 PM  [facility] [address] [place] [address] [phone] (office) [phone] (fax

## 2015-12-19 NOTE — Patient Instructions (Signed)
Medication Instructions:  Your physician recommends that you continue on your current medications as directed. Please refer to the Current Medication list given to you today.   Labwork: none  Testing/Procedures: none  Follow-Up: Remote monitoring is used to monitor your Pacemaker or ICD from home. This monitoring reduces the number of office visits required to check your device to one time per year. It allows [place] to keep an eye on the functioning of your device to ensure it is working properly. You are scheduled for a device check from home on 03/23/2016. You may send your transmission at any time that day. If you have a wireless device, the transmission will be sent automatically. After your physician reviews your transmission, you will receive a postcard with your next transmission date.  Your physician wants you to follow-up in: 12 months with Gypsy Balsam, NP. You will receive a reminder letter in the mail two months in advance. If you don't receive a letter, please call our office to schedule the follow-up appointment.    Any Other Special Instructions Will Be Listed Below (If Applicable).     If you need a refill on your cardiac medications before your next appointment, please call your pharmacy.

## 2016-01-30 DIAGNOSIS — R69 Illness, unspecified: Secondary | ICD-10-CM | POA: Diagnosis not present

## 2016-01-30 DIAGNOSIS — Z950 Presence of cardiac pacemaker: Secondary | ICD-10-CM | POA: Diagnosis not present

## 2016-01-30 DIAGNOSIS — G301 Alzheimer's disease with late onset: Secondary | ICD-10-CM | POA: Diagnosis not present

## 2016-01-30 DIAGNOSIS — I10 Essential (primary) hypertension: Secondary | ICD-10-CM | POA: Diagnosis not present

## 2016-01-30 DIAGNOSIS — I482 Chronic atrial fibrillation: Secondary | ICD-10-CM | POA: Diagnosis not present

## 2016-01-31 ENCOUNTER — Telehealth: Payer: Self-pay | Admitting: Internal Medicine

## 2016-01-31 NOTE — Telephone Encounter (Signed)
New Message:  Wesley Cook is calling to get assistance with setting up his Medtronic remote device.

## 2016-01-31 NOTE — Telephone Encounter (Signed)
Spoke w/ pt nurse and informed her that pt home monitor is working properly. Pt nurse verbalized understanding.

## 2016-02-13 DIAGNOSIS — R32 Unspecified urinary incontinence: Secondary | ICD-10-CM | POA: Diagnosis not present

## 2016-02-28 DIAGNOSIS — Z961 Presence of intraocular lens: Secondary | ICD-10-CM | POA: Diagnosis not present

## 2016-02-28 DIAGNOSIS — H5441 Blindness, right eye, normal vision left eye: Secondary | ICD-10-CM | POA: Diagnosis not present

## 2016-02-28 DIAGNOSIS — H401193 Primary open-angle glaucoma, unspecified eye, severe stage: Secondary | ICD-10-CM | POA: Diagnosis not present

## 2016-03-09 DIAGNOSIS — R1084 Generalized abdominal pain: Secondary | ICD-10-CM | POA: Diagnosis not present

## 2016-03-09 DIAGNOSIS — G301 Alzheimer's disease with late onset: Secondary | ICD-10-CM | POA: Diagnosis not present

## 2016-03-09 DIAGNOSIS — I482 Chronic atrial fibrillation: Secondary | ICD-10-CM | POA: Diagnosis not present

## 2016-03-09 DIAGNOSIS — I10 Essential (primary) hypertension: Secondary | ICD-10-CM | POA: Diagnosis not present

## 2016-03-09 DIAGNOSIS — R69 Illness, unspecified: Secondary | ICD-10-CM | POA: Diagnosis not present

## 2016-03-13 DIAGNOSIS — R32 Unspecified urinary incontinence: Secondary | ICD-10-CM | POA: Diagnosis not present

## 2016-03-23 ENCOUNTER — Telehealth: Payer: Self-pay | Admitting: Cardiology

## 2016-03-23 ENCOUNTER — Encounter: Payer: Medicare HMO | Admitting: *Deleted

## 2016-03-23 NOTE — Telephone Encounter (Signed)
Confirmed remote transmission w/ pt nurse.   

## 2016-03-27 ENCOUNTER — Encounter: Payer: Self-pay | Admitting: Cardiology

## 2016-04-06 DIAGNOSIS — R69 Illness, unspecified: Secondary | ICD-10-CM | POA: Diagnosis not present

## 2016-04-06 DIAGNOSIS — I10 Essential (primary) hypertension: Secondary | ICD-10-CM | POA: Diagnosis not present

## 2016-04-06 DIAGNOSIS — G301 Alzheimer's disease with late onset: Secondary | ICD-10-CM | POA: Diagnosis not present

## 2016-04-06 DIAGNOSIS — I480 Paroxysmal atrial fibrillation: Secondary | ICD-10-CM | POA: Diagnosis not present

## 2016-04-13 DIAGNOSIS — I10 Essential (primary) hypertension: Secondary | ICD-10-CM | POA: Diagnosis not present

## 2016-04-13 DIAGNOSIS — I482 Chronic atrial fibrillation: Secondary | ICD-10-CM | POA: Diagnosis not present

## 2016-04-13 DIAGNOSIS — G301 Alzheimer's disease with late onset: Secondary | ICD-10-CM | POA: Diagnosis not present

## 2016-04-13 DIAGNOSIS — R69 Illness, unspecified: Secondary | ICD-10-CM | POA: Diagnosis not present

## 2016-04-13 DIAGNOSIS — Z950 Presence of cardiac pacemaker: Secondary | ICD-10-CM | POA: Diagnosis not present

## 2016-04-16 ENCOUNTER — Emergency Department (HOSPITAL_COMMUNITY)
Admission: EM | Admit: 2016-04-16 | Discharge: 2016-04-29 | Disposition: A | Discharge status: Home / Self Care | Payer: Medicare HMO | Attending: Emergency Medicine | Admitting: Emergency Medicine

## 2016-04-16 ENCOUNTER — Encounter (HOSPITAL_COMMUNITY): Payer: Self-pay

## 2016-04-16 DIAGNOSIS — R410 Disorientation, unspecified: Secondary | ICD-10-CM | POA: Diagnosis not present

## 2016-04-16 DIAGNOSIS — F918 Other conduct disorders: Secondary | ICD-10-CM | POA: Insufficient documentation

## 2016-04-16 DIAGNOSIS — I129 Hypertensive chronic kidney disease with stage 1 through stage 4 chronic kidney disease, or unspecified chronic kidney disease: Secondary | ICD-10-CM | POA: Diagnosis not present

## 2016-04-16 DIAGNOSIS — R455 Hostility: Secondary | ICD-10-CM

## 2016-04-16 DIAGNOSIS — Z950 Presence of cardiac pacemaker: Secondary | ICD-10-CM | POA: Insufficient documentation

## 2016-04-16 DIAGNOSIS — Z7982 Long term (current) use of aspirin: Secondary | ICD-10-CM | POA: Diagnosis not present

## 2016-04-16 DIAGNOSIS — F0391 Unspecified dementia with behavioral disturbance: Secondary | ICD-10-CM | POA: Diagnosis not present

## 2016-04-16 DIAGNOSIS — F03918 Unspecified dementia, unspecified severity, with other behavioral disturbance: Secondary | ICD-10-CM | POA: Diagnosis present

## 2016-04-16 DIAGNOSIS — N183 Chronic kidney disease, stage 3 (moderate): Secondary | ICD-10-CM | POA: Insufficient documentation

## 2016-04-16 DIAGNOSIS — G309 Alzheimer's disease, unspecified: Secondary | ICD-10-CM | POA: Insufficient documentation

## 2016-04-16 DIAGNOSIS — F911 Conduct disorder, childhood-onset type: Secondary | ICD-10-CM | POA: Diagnosis not present

## 2016-04-16 DIAGNOSIS — F0280 Dementia in other diseases classified elsewhere without behavioral disturbance: Secondary | ICD-10-CM | POA: Insufficient documentation

## 2016-04-16 DIAGNOSIS — R402411 Glasgow coma scale score 13-15, in the field [EMT or ambulance]: Secondary | ICD-10-CM | POA: Diagnosis not present

## 2016-04-16 DIAGNOSIS — F919 Conduct disorder, unspecified: Secondary | ICD-10-CM | POA: Diagnosis present

## 2016-04-16 DIAGNOSIS — G308 Other Alzheimer's disease: Secondary | ICD-10-CM | POA: Insufficient documentation

## 2016-04-16 DIAGNOSIS — Z0189 Encounter for other specified special examinations: Secondary | ICD-10-CM

## 2016-04-16 LAB — COMPREHENSIVE METABOLIC PANEL
ALT: 38 U/L (ref 17–63)
AST: 27 U/L (ref 15–41)
Albumin: 4.5 g/dL (ref 3.5–5.0)
Alkaline Phosphatase: 75 U/L (ref 38–126)
Anion gap: 5 (ref 5–15)
BUN: 11 mg/dL (ref 6–20)
CO2: 30 mmol/L (ref 22–32)
Calcium: 9.3 mg/dL (ref 8.9–10.3)
Chloride: 103 mmol/L (ref 101–111)
Creatinine, Ser: 1.15 mg/dL (ref 0.61–1.24)
GFR calc Af Amer: >60 mL/min (ref >60)
GFR calc non Af Amer: 58 mL/min — ABNORMAL LOW (ref >60)
Glucose, Bld: 108 mg/dL — ABNORMAL HIGH (ref 65–99)
Potassium: 4.6 mmol/L (ref 3.5–5.1)
Sodium: 138 mmol/L (ref 135–145)
Total Bilirubin: 0.7 mg/dL (ref 0.3–1.2)
Total Protein: 7.2 g/dL (ref 6.5–8.1)

## 2016-04-16 LAB — URINALYSIS, ROUTINE W REFLEX MICROSCOPIC
Bilirubin Urine: NEGATIVE
Glucose, UA: NEGATIVE mg/dL
Hgb urine dipstick: NEGATIVE
Ketones, ur: NEGATIVE mg/dL
Leukocytes, UA: NEGATIVE
Nitrite: NEGATIVE
Protein, ur: NEGATIVE mg/dL
Specific Gravity, Urine: 1.02 (ref 1.005–1.030)
pH: 7.5 (ref 5.0–8.0)

## 2016-04-16 LAB — CBC WITH DIFFERENTIAL/PLATELET
Basophils Absolute: 0 10*3/uL (ref 0.0–0.1)
Basophils Relative: 0 %
Eosinophils Absolute: 0.2 10*3/uL (ref 0.0–0.7)
Eosinophils Relative: 2 %
HCT: 43.9 % (ref 39.0–52.0)
Hemoglobin: 15.1 g/dL (ref 13.0–17.0)
Lymphocytes Relative: 28 %
Lymphs Abs: 1.8 10*3/uL (ref 0.7–4.0)
MCH: 29.6 pg (ref 26.0–34.0)
MCHC: 34.4 g/dL (ref 30.0–36.0)
MCV: 86.1 fL (ref 78.0–100.0)
Monocytes Absolute: 0.3 10*3/uL (ref 0.1–1.0)
Monocytes Relative: 5 %
Neutro Abs: 4.1 10*3/uL (ref 1.7–7.7)
Neutrophils Relative %: 65 %
Platelets: 138 10*3/uL — ABNORMAL LOW (ref 150–400)
RBC: 5.1 MIL/uL (ref 4.22–5.81)
RDW: 12.9 % (ref 11.5–15.5)
WBC: 6.3 10*3/uL (ref 4.0–10.5)

## 2016-04-16 LAB — RAPID URINE DRUG SCREEN, HOSP PERFORMED
Amphetamines: NOT DETECTED
Barbiturates: NOT DETECTED
Benzodiazepines: NOT DETECTED
Cocaine: NOT DETECTED
Opiates: NOT DETECTED
Tetrahydrocannabinol: NOT DETECTED

## 2016-04-16 MED ORDER — DONEPEZIL HCL 5 MG PO TABS
10.0000 mg | ORAL_TABLET | Freq: Every day | ORAL | Status: DC
  Administered 2016-04-16 – 2016-04-29 (×14): 10 mg via ORAL
  Filled 2016-04-16 (×14): qty 2

## 2016-04-16 MED ORDER — TIMOLOL MALEATE 0.5 % OP SOLN
1.0000 [drp] | Freq: Every day | OPHTHALMIC | Status: DC
  Administered 2016-04-16 – 2016-04-29 (×15): 1 [drp] via OPHTHALMIC
  Filled 2016-04-16 (×2): qty 5

## 2016-04-16 MED ORDER — ACETAMINOPHEN 500 MG PO TABS
500.0000 mg | ORAL_TABLET | ORAL | Status: DC | PRN
  Administered 2016-04-24 – 2016-04-27 (×2): 500 mg via ORAL
  Filled 2016-04-16 (×3): qty 1

## 2016-04-16 MED ORDER — LORAZEPAM 0.5 MG PO TABS
0.5000 mg | ORAL_TABLET | Freq: Every day | ORAL | Status: DC | PRN
  Administered 2016-04-17 – 2016-04-23 (×5): 0.5 mg via ORAL
  Filled 2016-04-16 (×5): qty 1

## 2016-04-16 MED ORDER — DONEPEZIL HCL 5 MG PO TABS: 10.0000 mg | ORAL_TABLET | Freq: Every day | ORAL | Status: DC

## 2016-04-16 MED ORDER — ASPIRIN EC 81 MG PO TBEC: 81.0000 mg | DELAYED_RELEASE_TABLET | Freq: Every day | ORAL | Status: DC

## 2016-04-16 MED ORDER — AMLODIPINE BESYLATE 10 MG PO TABS
10.0000 mg | ORAL_TABLET | Freq: Every day | ORAL | Status: DC
  Administered 2016-04-16 – 2016-04-29 (×14): 10 mg via ORAL
  Filled 2016-04-16 (×14): qty 1

## 2016-04-16 MED ORDER — GUAIFENESIN 100 MG/5ML PO SYRP
200.0000 mg | ORAL_SOLUTION | Freq: Four times a day (QID) | ORAL | Status: DC | PRN
  Filled 2016-04-16: qty 10

## 2016-04-16 MED ORDER — LATANOPROST 0.005 % OP SOLN
1.0000 [drp] | Freq: Every day | OPHTHALMIC | Status: DC
  Administered 2016-04-16 – 2016-04-28 (×12): 1 [drp] via OPHTHALMIC
  Filled 2016-04-16 (×2): qty 2.5

## 2016-04-16 MED ORDER — MAGNESIUM HYDROXIDE 400 MG/5ML PO SUSP
30.0000 mL | Freq: Every evening | ORAL | Status: DC | PRN
  Filled 2016-04-16: qty 30

## 2016-04-16 MED ORDER — PRAVASTATIN SODIUM 20 MG PO TABS
20.0000 mg | ORAL_TABLET | Freq: Every day | ORAL | Status: DC
  Administered 2016-04-16 – 2016-04-29 (×14): 20 mg via ORAL
  Filled 2016-04-16 (×16): qty 1

## 2016-04-16 MED ORDER — LOPERAMIDE HCL 2 MG PO CAPS: 2.0000 mg | ORAL_CAPSULE | Freq: Every day | ORAL | Status: DC | PRN

## 2016-04-16 MED ORDER — AMLODIPINE BESYLATE 10 MG PO TABS: 10.0000 mg | ORAL_TABLET | Freq: Every day | ORAL | Status: DC

## 2016-04-16 MED ORDER — ALUM & MAG HYDROXIDE-SIMETH 200-200-20 MG/5ML PO SUSP: 30.0000 mL | Freq: Four times a day (QID) | ORAL | Status: DC | PRN

## 2016-04-16 MED ORDER — LEVETIRACETAM 500 MG PO TABS
500.0000 mg | ORAL_TABLET | Freq: Two times a day (BID) | ORAL | Status: DC
  Administered 2016-04-16 – 2016-04-29 (×25): 500 mg via ORAL
  Filled 2016-04-16 (×28): qty 1

## 2016-04-16 MED ORDER — ASPIRIN EC 81 MG PO TBEC
81.0000 mg | DELAYED_RELEASE_TABLET | Freq: Every day | ORAL | Status: DC
  Administered 2016-04-16 – 2016-04-29 (×14): 81 mg via ORAL
  Filled 2016-04-16 (×14): qty 1
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
## 2016-04-16 NOTE — Discharge Instructions (Signed)
Anger Management °Anger is a normal human emotion. However, anger can range from mild irritation to rage. When your anger becomes harmful to yourself or others, it is unhealthy anger.  °CAUSES  °There are many reasons for unhealthy anger. Many people learn how to express anger from observing how their family expressed anger. In troubled, chaotic, or abusive families, anger can be expressed as rage or even violence. Children can grow up never learning how healthy anger can be expressed. Factors that contribute to unhealthy anger include:  °· Drug or alcohol abuse. °· Post-traumatic stress disorder. °· Traumatic brain injury. °COMPLICATIONS  °People with unhealthy anger tend to overreact and retaliate against a real or imagined threat. The need to retaliate can turn into violence or verbal abuse against another person. Chronic anger can lead to health problems, such as hypertension, high blood pressure, and depression. °TREATMENT  °Exercising, relaxing, meditating, or writing out your feelings all can be beneficial in managing moderate anger. For unhealthy anger, the following methods may be used: °· Cognitive-behavioral counseling (learning skills to change the thoughts that influence your mood). °· Relaxation training. °· Interpersonal counseling. °· Assertive communication skills. °· Medication. °  °This information is not intended to replace advice given to you by your health care provider. Make sure you discuss any questions you have with your health care provider. °  °Document Released: 08/23/2007 Document Revised: 01/18/2012 Document Reviewed: 01/01/2011 °Elsevier Interactive Patient Education ©2016 Elsevier Inc. ° °

## 2016-04-16 NOTE — ED Notes (Signed)
Bed: ZO10WA10 Expected date:  Expected time:  Means of arrival:  Comments: EMS- nursing home/psych
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
## 2016-04-16 NOTE — ED Notes (Signed)
Charge RN Environmental education officerLynnsey RN states sitter at [employer]

## 2016-04-16 NOTE — BH Assessment (Signed)
Faxed clinical information to the following facilities for geriatric-psychiatry placement:  [facility]   [address]Liem Copenhaver Ellis Patsy BaltimoreWarrick Jr, [place], [facility], [facility] Triage Specialist [phone]
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
## 2016-04-16 NOTE — ED Notes (Signed)
Bed: WA31 Expected date:  Expected time:  Means of arrival:  Comments: 

## 2016-04-16 NOTE — ED Notes (Signed)
SEVERAL ATTEMPTS MADE TO CALL FACILITY WITHOUT SUCCESS. [phone]
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
## 2016-04-16 NOTE — ED Notes (Signed)
MD at bedside. 

## 2016-04-16 NOTE — ED Notes (Signed)
Patient has one bag of belongings in locker 26. 

## 2016-04-17 ENCOUNTER — Encounter (HOSPITAL_COMMUNITY): Payer: Self-pay | Admitting: Nurse Practitioner

## 2016-04-17 ENCOUNTER — Emergency Department (HOSPITAL_COMMUNITY): Payer: Medicare HMO

## 2016-04-17 DIAGNOSIS — F03918 Unspecified dementia, unspecified severity, with other behavioral disturbance: Secondary | ICD-10-CM | POA: Diagnosis present

## 2016-04-17 DIAGNOSIS — R410 Disorientation, unspecified: Secondary | ICD-10-CM | POA: Diagnosis not present

## 2016-04-17 DIAGNOSIS — F0391 Unspecified dementia with behavioral disturbance: Secondary | ICD-10-CM | POA: Diagnosis present

## 2016-04-17 MED ORDER — TRAZODONE HCL 50 MG PO TABS
50.0000 mg | ORAL_TABLET | Freq: Every evening | ORAL | Status: DC | PRN
  Administered 2016-04-17 – 2016-04-23 (×3): 50 mg via ORAL
  Filled 2016-04-17 (×3): qty 1

## 2016-04-17 MED ORDER — CITALOPRAM HYDROBROMIDE 10 MG PO TABS
10.0000 mg | ORAL_TABLET | Freq: Every day | ORAL | Status: DC
  Administered 2016-04-17 – 2016-04-18 (×2): 10 mg via ORAL
  Filled 2016-04-17 (×2): qty 1

## 2016-04-17 MED ORDER — ONDANSETRON 4 MG PO TBDP
4.0000 mg | ORAL_TABLET | Freq: Once | ORAL | Status: AC
  Administered 2016-04-17: 4 mg via ORAL
  Filled 2016-04-17: qty 1

## 2016-04-17 NOTE — ED Notes (Signed)
Wife called to check on husband.

## 2016-04-17 NOTE — ED Notes (Signed)
Patient's wife called to check on him.

## 2016-04-17 NOTE — ED Notes (Signed)
Patient is asleep.  
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
## 2016-04-17 NOTE — BH Assessment (Signed)
[facility]    04/17/16: Patient continues to be a Gero-Psych placement as appropriate bed placement is investigated.
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
## 2016-04-17 NOTE — ED Notes (Signed)
Patient's wife called for update.

## 2016-04-17 NOTE — ED Notes (Signed)
Pt sitting in bed, conversing with staff. Pt calm and cooperative, NAD. Sitter at bedside. Will continue to monitor.

## 2016-04-17 NOTE — BH Assessment (Signed)
[name]  Per Thedore MinsMojeed Akintayo, MD, this pt continues to require psychiatric hospitalization at a facility that provides geriatric specialty services.  The following facilities have been contacted to seek placement for this pt, with results as noted:  Beds available, information sent, decision pending:  [facility]   On wait list:  [facility]   Declined:  [facility] (reason unspecified)   At capacity[facility]  [facility]   Exclusionary criteria:  [facility] (due to [facility]) [facility] ([facility] to [facility]) [facility] ([facility] to [facility]) [facility] (due to [facility])   Wesley Canninghomas Nahome Bublitz, MA Triage Specialist [phone]
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
## 2016-04-18 ENCOUNTER — Encounter (HOSPITAL_COMMUNITY): Payer: Self-pay | Admitting: Registered Nurse

## 2016-04-18 DIAGNOSIS — F0391 Unspecified dementia with behavioral disturbance: Secondary | ICD-10-CM | POA: Diagnosis not present

## 2016-04-18 MED ORDER — CITALOPRAM HYDROBROMIDE 10 MG PO TABS
20.0000 mg | ORAL_TABLET | Freq: Every day | ORAL | Status: DC
  Administered 2016-04-19 – 2016-04-29 (×11): 20 mg via ORAL
  Filled 2016-04-18 (×11): qty 2

## 2016-04-18 MED ORDER — CITALOPRAM HYDROBROMIDE 20 MG PO TABS: 20.0000 mg | ORAL_TABLET | Freq: Every day | ORAL | Status: DC
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
## 2016-04-18 NOTE — BHH Suicide Risk Assessment (Cosign Needed)
Suicide Risk Assessment  Discharge Assessment   [facility]   Principal Problem: Dementia with behavioral disturbance Discharge Diagnoses:  Patient Active Problem List   Diagnosis Date Noted  . Dementia with behavioral disturbance [F03.91] 04/17/2016  . Seizures (HCC) [R56.9]   . AKI (acute kidney injury) (HCC) [N17.9]   . Chronic kidney disease (CKD), stage III (moderate) [N18.3] 04/11/2015  . New onset a-fib (HCC) [I48.91] 04/11/2015  . Atrial fibrillation, unspecified [I48.91]   . Syncope and collapse [R55]   . Acute kidney injury (HCC) [N17.9]   . Acute encephalopathy [G93.40] 04/10/2015  . Sinus node dysfunction (HCC) [I49.5] 01/29/2015  . Essential hypertension, benign [I10] 04/16/2014  . Screening for lipoid disorders [Z13.220] 04/16/2014  . Cardiac pacemaker in situ [Z95.0] 04/16/2014    Total Time spent with patient: 30 minutes  Musculoskeletal: Strength & Muscle Tone: within normal limits Gait & Station: normal Patient leans: N/A Psychiatric Specialty Exam:   Blood pressure 127/81, pulse 76, temperature 98.3 F (36.8 C), temperature source Oral, resp. rate 22, SpO2 100 %.There is no weight on file to calculate BMI.   General Appearance: Neat  Eye Contact: Good  Speech: Clear and Coherent  Volume: Normal  Mood: Euthymic  Affect: Appropriate  Thought Process: Coherent  Orientation: Other: time disoriented "I don't know the last time I looked at a calendar."   Thought Content: Rumination  Suicidal Thoughts: No  Homicidal Thoughts: No   Memory: Immediate; Poor Recent; Poor Remote; Poor  Judgement: Fair  Insight: Fair  Psychomotor Activity: Normal  Concentration: Concentration: Fair and Attention Span: Fair  Recall: FiservFair  Fund of Knowledge: Fair  Language: Fair  Akathisia: Negative  Handed: Right  AIMS (if indicated):    Assets: Resilience  ADL's: Intact  Cognition:  Impaired, Mild  Sleep: poor         Mental Status Per Nursing Assessment::   On Admission:     Demographic Factors:  Age 80 or older  Loss Factors: Decline in physical health  Historical Factors: Impulsivity  Risk Reduction Factors:   Living in assisted living facility  Continued Clinical Symptoms:  Unstable or Poor Therapeutic Relationship  Cognitive Features That Contribute To Risk:  Closed-mindedness and Loss of executive function    Suicide Risk:  Minimal: No identifiable suicidal ideation.  Patients presenting with no risk factors but with morbid ruminations; may be classified as minimal risk based on the severity of the depressive symptoms  Follow-up Information    Follow up with Ron ParkerBOWEN,SAMUEL, MD.   Specialty:  Internal Medicine      Plan Of Care/Follow-up recommendations:  Activity:  As tolerated Diet:  As tolerated  Cerise Lieber, NP 04/18/2016, 12:21 PM

## 2016-04-19 NOTE — ED Notes (Signed)
shift report from [place]
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
## 2016-04-19 NOTE — Progress Notes (Signed)
Disposition CSW [facility] after the Psychiatrist decided to continue with plan for inpatient Geropsychiatry.  [facility] Intake staff states they were unable to look at the referral or take any patients in over the weekend due to staffing problems.  [facility] state they will look at referrals tomorrow.  Seward SpeckLeo Ahni [facility] Dr. Cayetano Coll Y TosteCSW,LCAS Behavioral Health Disposition CSW [phone]

## 2016-04-20 DIAGNOSIS — F911 Conduct disorder, childhood-onset type: Secondary | ICD-10-CM | POA: Diagnosis not present

## 2016-04-20 DIAGNOSIS — F0391 Unspecified dementia with behavioral disturbance: Secondary | ICD-10-CM | POA: Diagnosis not present

## 2016-04-20 DIAGNOSIS — R455 Hostility: Secondary | ICD-10-CM | POA: Insufficient documentation

## 2016-04-20 NOTE — ED Notes (Addendum)
Pt is pleasant and asked the nurse if she had one million dollars she could give him. Pt remains safe with a sitter. Received a call from Tom that pt needs an EKG. EKG performed at the bedside. (8:45am )Pt ate 75% of his breakfast. He then fell asleep with both side rails up. Pt would not awaken when the doctor came into the room.Pt is awake and requested to speak with the MD. Theodoro Gristave from TTS will make MD aware. (11am )Pt remains cooperative. 7pm _Report to oncoming shift.

## 2016-04-20 NOTE — BH Assessment (Signed)
[name]    Case was staffed with Shaune PollackLord DNP who recommended patient be admitted to the Observation unit and evaluated in the a.m. with possible medication interventions.
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
## 2016-04-20 NOTE — Progress Notes (Addendum)
CSW called facilities to follow-up on faxed referrals:  [facility]- spoke with Delorise ShinerGrace who states they did not have a referral on patient, was asked to send referral, referral was resent  [facility] spoke with Gala Romneyoug who stated referral needed to be resent, referral was resent  [facility]- spoke with French Anaracy, she stated the referral had not been reviewed  [facility]- was informed to call back about referral  Elenore PaddyLaVonia Chez Bulnes, LCSWA [phone] ED CSW 04/20/2016 10:58 AM

## 2016-04-20 NOTE — Progress Notes (Signed)
CSW received TC from [facility] [place] confirming that pt is now on their waitlist.  Daytime CSW will follow for disposition.  Illene SilverJody Doyel Mulkern, LCSW [facility] ED/Evening Coverage [phone]

## 2016-04-21 NOTE — Progress Notes (Addendum)
CSW spoke with Wesley Cook at [facility] who states patient remains on their wait list.   CSW spoke with [facility] who states he did not see the referral. CSW refaxed referral.  Wesley Cook, LCSWA [phone] ED CSW 04/21/2016 8:18 AM

## 2016-04-21 NOTE — Progress Notes (Signed)
CSW received T/C from [facility] reporting that Patient has been placed on their waiting list.          Lance MussAshley Gardner,MSW, LCSW [facility] ED/33M Clinical Social Worker [phone]

## 2016-04-21 NOTE — ED Notes (Signed)
Attempted to medicate pt with Tylenol due to headache, after scanning pt arm band and medication, pt sts doesn't want any pain medicine. Tylenol wasted.

## 2016-04-21 NOTE — ED Notes (Signed)
Bed: [id number] Expected date:  Expected time:  Means of arrival:  Comments: Hold for TCU

## 2016-04-21 NOTE — Progress Notes (Addendum)
Patient's case was discussed in the Quality Collaborative meeting this AM. CSW was informed to [facility].   Referral was completed and faxed with demographics completed via phone with Robin[facility] [phone].  Authorization#: [id number] 04/21/16 through 04/27/16  Elenore PaddyLaVonia Naasir Carreira, LCSWA [phone] ED CSW 04/21/2016 2:41 PM
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
## 2016-04-22 ENCOUNTER — Encounter (HOSPITAL_COMMUNITY): Payer: Self-pay

## 2016-04-22 NOTE — Progress Notes (Signed)
Call placed to pt's wife to discuss pt's disposition.  Message left.  Await return call.  Illene SilverJody Aaliyan Brinkmeier, LCSW [facility]  Evening/ED Coverage [phone]

## 2016-04-22 NOTE — Progress Notes (Addendum)
CSW spoke with [facility]in [facility] who states patient remains on the wait list.   CSW spoke with Mills KollerAllysa at [facility] who states patient remains on the wait list.  Elenore PaddyLaVonia Jianna Drabik, LCSWA [phone] ED CSW 04/22/2016 9:58 AM

## 2016-04-22 NOTE — ED Notes (Signed)
Bed: WA32 Expected date:  Expected time:  Means of arrival:  Comments: 
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
## 2016-04-22 NOTE — Progress Notes (Signed)
CSW was informed by the Psychiatrist that patient had been psychiatrically cleared. CSW attempted telephone call to Antonietta JewelBarbara Friis, patient's wife [phone]. Message stated wireless customer not available at the time.  Elenore PaddyLaVonia Lennox Leikam, LCSWA [phone] ED CSW 04/22/2016 12:08 PM

## 2016-04-23 NOTE — Progress Notes (Addendum)
Patient's case discussed in the Quality Collaborative meeting. APS report completed with Jennette Kettleendra Marcus, APS Social Worker, [place].  CSW spoke with patient's wife, [name] [phone]. She stated she spoke with one family member that she is waiting to here from at this time to see if they can take patient. She stated there is no other family that can care for him. She stated patient receives SSI and Medicaid, along with [facility] Medicare and this is how [facility] was being paid monthly ($1,515). She stated [facility] named [name] is supposed to come today and see patient to see if he would be appropriate for their facility.  Call received from [name] wife who states [facility] be to costly for patient. She stated she spoke with someone at [facility] that is to come and assess patient either today or tomorrow by the name of [name]. She stated she is having a procedure to place a stint in her heart and she is unsure if she has had another stroke due to the pain in her arm. CSW continue to follow.  Call received from Jennette Kettleendra Marcus, APS Intake who states patient's report was unsubstantiated, as she stated they could not determine a current need of protection. She stated the Adult Home Specialist, Atilano MedianJewell Singleton if following up with [facility] to see if the discharge was appropriate or inappropriate.   Staffed with Asst. Social Work Interior and spatial designerDirector and call made to [facility], [place], [place] [phone]. Message left for the Administrator with name and contact number for return call.   Elenore PaddyLaVonia Clarke Amburn, LCSWA [phone] ED CSW Sep 29, 202017 12:35 PM

## 2016-04-24 NOTE — Progress Notes (Addendum)
Staffed with Asst. Social Work Interior and spatial designerDirector and will fax information.  Faxed patient's information to [facility], [place] [phone]. Fax# [phone]   Faxed patient's information to [facility], [facility] [phone] or Malon Kindlenthony [facility] [phone]. Fax# [phone]  Spoke with patient at bedside with sitter present. Informed patient seeking placement for him at this time. Patient was accepting.   Elenore PaddyLaVonia Levina Boyack, LCSWA [phone] ED CSW 04/24/2016 3:55 PM
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
## 2016-04-24 NOTE — ED Notes (Signed)
Pt resting in bed comfortably, sitter at bedside. Pt NAD, will continue to montior.

## 2016-04-24 NOTE — NC FL2 (Signed)
[place] MEDICAID FL2 LEVEL OF CARE SCREENING TOOL     IDENTIFICATION  Patient Name: Wesley Cook Birthdate: 1934-06-13 Sex: male Admission Date (Current Location): 04/16/2016  [facility] and [place] Number:  Producer, television/film/video and Address:  [facility],  [address] [place] [address]      Provider Number: [id number]  Attending Physician Name and Address:  Provider Default, MD  Relative Name and Phone Number:       Current Level of Care: Hospital Recommended Level of Care: Assisted Living Facility, Family Care Home Prior Approval Number:    Date Approved/Denied:   PASRR Number:    Discharge Plan:      Current Diagnoses: Patient Active Problem List   Diagnosis Date Noted  . Aggressive outburst   . Dementia with behavioral disturbance 04/17/2016  . Seizures (HCC)   . AKI (acute kidney injury) (HCC)   . Chronic kidney disease (CKD), stage III (moderate) 04/11/2015  . New onset a-fib (HCC) 04/11/2015  . Atrial fibrillation, unspecified   . Syncope and collapse   . Acute kidney injury (HCC)   . Acute encephalopathy 04/10/2015  . Sinus node dysfunction (HCC) 01/29/2015  . Essential hypertension, benign 04/16/2014  . Screening for lipoid disorders 04/16/2014  . Cardiac pacemaker in situ 04/16/2014    Orientation RESPIRATION BLADDER Height & Weight     Self, Time, Place  Normal Continent Weight:   Height:     BEHAVIORAL SYMPTOMS/MOOD NEUROLOGICAL BOWEL NUTRITION STATUS      Continent Diet  AMBULATORY STATUS COMMUNICATION OF NEEDS Skin   Independent (Per Nurse, some unsteadiness) Verbally Normal                       Personal Care Assistance Level of Assistance  Bathing, Dressing, Feeding Bathing Assistance: Limited assistance Feeding assistance: Independent Dressing Assistance: Limited assistance     Functional Limitations Info  Sight, Hearing, Speech Sight Info: Adequate Hearing Info: Adequate Speech Info: Adequate    SPECIAL  CARE FACTORS FREQUENCY                       Contractures      Additional Factors Info  Code Status, Allergies (Full Code)   Allergies Info:  (No Known Allergies)           Current Medications (04/24/2016):  This is the current hospital active medication list Current Facility-Administered Medications  Medication Dose Route Frequency Provider Last Rate Last Dose  . acetaminophen (TYLENOL) tablet 500 mg  500 mg Oral Q4H PRN Raeford Razor, MD      . alum & mag hydroxide-simeth (MAALOX/MYLANTA) 200-200-20 MG/5ML suspension 30 mL  30 mL Oral Q6H PRN Raeford Razor, MD      . amLODipine (NORVASC) tablet 10 mg  10 mg Oral Daily Raeford Razor, MD   10 mg at 04/24/16 0905  . aspirin EC tablet 81 mg  81 mg Oral Daily Raeford Razor, MD   81 mg at 04/24/16 0904  . citalopram (CELEXA) tablet 20 mg  20 mg Oral Daily Shuvon B Rankin, NP   20 mg at 04/24/16 1132  . donepezil (ARICEPT) tablet 10 mg  10 mg Oral Daily Raeford Razor, MD   10 mg at 04/24/16 1132  . guaifenesin (ROBITUSSIN) 100 MG/5ML syrup 200 mg  200 mg Oral Q6H PRN Raeford Razor, MD      . latanoprost (XALATAN) 0.005 % ophthalmic solution 1 drop  1 drop Both Eyes  QHS Raeford RazorStephen Kohut, MD   1 drop at 04/23/16 2210  . levETIRAcetam (KEPPRA) tablet 500 mg  500 mg Oral BID Raeford RazorStephen Kohut, MD   500 mg at 04/24/16 0905  . loperamide (IMODIUM) capsule 2 mg  2 mg Oral Daily PRN Raeford RazorStephen Kohut, MD      . LORazepam (ATIVAN) tablet 0.5 mg  0.5 mg Oral Daily PRN Raeford RazorStephen Kohut, MD   0.5 mg at 04/23/16 2209  . magnesium hydroxide (MILK OF MAGNESIA) suspension 30 mL  30 mL Oral QHS PRN Raeford RazorStephen Kohut, MD      . pravastatin (PRAVACHOL) tablet 20 mg  20 mg Oral Daily Raeford RazorStephen Kohut, MD   20 mg at 04/23/16 0920  . timolol (TIMOPTIC) 0.5 % ophthalmic solution 1 drop  1 drop Both Eyes Daily Raeford RazorStephen Kohut, MD   1 drop at 04/24/16 1133  . traZODone (DESYREL) tablet 50 mg  50 mg Oral QHS PRN Thedore MinsMojeed Akintayo, MD   50 mg at 04/23/16 2210   Current Outpatient  Prescriptions  Medication Sig Dispense Refill  . acetaminophen (TYLENOL) 500 MG tablet Take 500 mg by mouth every 4 (four) hours as needed for mild pain, moderate pain or fever.     Marland Kitchen. alum & mag hydroxide-simeth (MAALOX/MYLANTA) 200-200-20 MG/5ML suspension Take 30 mLs by mouth every 6 (six) hours as needed for indigestion or heartburn.    Marland Kitchen. amLODipine (NORVASC) 10 MG tablet Take 1 tablet (10 mg total) by mouth daily. 30 tablet 0  . aspirin EC 81 MG tablet Take 1 tablet (81 mg total) by mouth daily.    Marland Kitchen. donepezil (ARICEPT) 10 MG tablet Take 10 mg by mouth daily.    Marland Kitchen. guaifenesin (ROBITUSSIN) 100 MG/5ML syrup Take 200 mg by mouth every 6 (six) hours as needed for cough.    . levETIRAcetam (KEPPRA) 500 MG tablet Take 1 tablet (500 mg total) by mouth 2 (two) times daily. 60 tablet 0  . loperamide (IMODIUM) 2 MG capsule Take 2 mg by mouth daily as needed for diarrhea or loose stools.     Marland Kitchen. LORazepam (ATIVAN) 0.5 MG tablet Take 0.5 mg by mouth daily as needed for anxiety (agitation).    . magnesium hydroxide (MILK OF MAGNESIA) 400 MG/5ML suspension Take 30 mLs by mouth at bedtime as needed for mild constipation.    Marland Kitchen. neomycin-bacitracin-polymyxin (NEOSPORIN) ointment Apply 1 application topically as needed for wound care. apply to eye    . PARoxetine (PAXIL) 20 MG tablet Take 20 mg by mouth daily.    . pravastatin (PRAVACHOL) 20 MG tablet Take 1 tablet (20 mg total) by mouth daily. 30 tablet 0  . timolol (TIMOPTIC) 0.5 % ophthalmic solution Place 1 drop into both eyes daily.   6  . Travoprost, BAK Free, (TRAVATAN) 0.004 % SOLN ophthalmic solution Place 1 drop into both eyes at bedtime.    . citalopram (CELEXA) 20 MG tablet Take 1 tablet (20 mg total) by mouth daily. 30 tablet 0     Discharge Medications: Please see discharge summary for a list of discharge medications.  Relevant Imaging Results:  Relevant Lab Results:   Additional Information    Claudean SeveranceLaVonia M Alvaretta Eisenberger, LCSW

## 2016-04-24 NOTE — ED Notes (Signed)
Pt given sandwich for dinner, pt laying in bed denies any other needs at the moment

## 2016-04-24 NOTE — BH Assessment (Signed)
[name]  At 11:22 Robinette @ [facility] reports that pt remains on their wait list.  Henchy Mccauley, MA Triage Specialist [phone]     

## 2016-04-25 NOTE — ED Notes (Addendum)
Pt is sleeping on his left side with regular respirations. A sitter remains at this bedside. Pt is very talkative today and asked what day it was. 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 Pt told the writer he only was paid $75.00 -$80.00 for a RT bus trip. He appears to enjoy having someone to talk to and is very pleasant with the sitters . Pt remains cooperative. Pts wife phoned and pt responded,"tell her to get up here to visit ASAP." Report to oncoming shift. (3pm)

## 2016-04-25 NOTE — ED Notes (Signed)
Pt appears to be sleeping in bed, respirations even and unlabored. Sitter at bedside.

## 2016-04-25 NOTE — ED Notes (Signed)
Pt appears to sleeping in bed comfortably. Respirations even and unlabored. Sitter remains at bedside

## 2016-04-26 NOTE — ED Notes (Signed)
Pt informed moving to Room 31.  Pt moved in bed.
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
## 2016-04-26 NOTE — Progress Notes (Signed)
Pt is refusing any assistance with hygiene and refusing a bath at the present time.

## 2016-04-26 NOTE — ED Notes (Signed)
Bed: [id number] Expected date:  Expected time:  Means of arrival:  Comments: Tr 3

## 2016-04-27 NOTE — Progress Notes (Signed)
[facility] who states patient remains on the wait list.   Wesley Cook, LCSWA [phone] ED CSW 04/27/2016 8:48 AM

## 2016-04-27 NOTE — Progress Notes (Signed)
CSW spoke with Assistant CSW Director who has spoke with owner of [employer] [employer], Jinny SandersBetty Davis who wants information regarding pt.  CSW will fax owner a referral packet in order to try and obtain appropriate placement.  Trish MageBrittney Miguelina Fore, LCSWA [phone] ED CSW 04/27/2016 6:34 PM

## 2016-04-27 NOTE — ED Notes (Signed)
Patient assisted to shower.  His bed linens changed and clean paper scrubs placed back on.

## 2016-04-27 NOTE — ED Notes (Signed)
Pt is pleasant and cooperative . He walked out of his room and then went back to bed. Pt contracts for safety. He remains with a Comptrollersitter.

## 2016-04-27 NOTE — Progress Notes (Signed)
CSW reached out to [facility] [phone] of [facility] to follow up regarding placement. However, she did not answer the phone. CSW will continue to follow up.  Trish MageBrittney Emeree Mahler, LCSWA [phone] ED CSW 04/27/2016 8:37 PM

## 2016-04-27 NOTE — Progress Notes (Signed)
[facility] for [facility] She stated they do not have any geriatric beds at the time.  Attempted call to [employer] for follow-up. No answer at the time.  Elenore PaddyLaVonia Linden Tagliaferro, LCSWA [phone] ED CSW 04/27/2016 11:47 AM

## 2016-04-27 NOTE — Progress Notes (Addendum)
Spoke with [facility], [place]ucker [phone] regarding follow-up for placement. She stated she would speak with Malon KindleAnthony Rucker and call CSW back.   Elenore PaddyLaVonia Rinda Rollyson, LCSWA [phone] ED CSW 04/27/2016 11:56 AM

## 2016-04-27 NOTE — Progress Notes (Signed)
Completed follow-up with [facility], [place] [phone]. She stated patient was declined due to aggression.  Elenore PaddyLaVonia Doloros Kwolek, LCSWA [phone] ED CSW 04/27/2016 11:50 AM

## 2016-04-27 NOTE — Progress Notes (Signed)
Attempted call to Jinny SandersBetty Davis with [facility] for possible placement. Mailbox full unable to leave message.   Elenore PaddyLaVonia Jaquasia Doscher, LCSWA [phone] ED CSW 04/27/2016 12:46 PM

## 2016-04-27 NOTE — ED Notes (Signed)
Patient is chatty and pleasantly conversant this morning.

## 2016-04-28 NOTE — ED Notes (Addendum)
Pt is pleasant and cooperative this am. He ate breakfast and stated he slept good last pm. Pt remains with a sitter. Pt did get a bed bath but has been sleeping most of the day. He stated he feels okay but is just tired. (3:40pm)Report to oncoming shift.(7:30pm)

## 2016-04-28 NOTE — Progress Notes (Addendum)
Attempted call to [facility] [phone] Rucker or Malon Kindlenthony Rucker [phone]. Unable to leave messages on either line.  Message sent to Jinny SandersBetty Davis, [facility] regarding placement. Waiting for response.  Spoke with Lamar LaundrySonya at [facility], patient remains on wait list.  Spoke with Alysa at [facility], patient remains on wait list.  Called received from Windell MouldingRuth with [facility] who states they will not be able to accept patient, as he would be a placement issue after being discharged since he [facility].  Faxed demographic information to Jinny SandersBetty Davis with [facility]. Fax# [phone]   Elenore PaddyLaVonia Jalaya Sarver, LCSWA [phone] ED CSW 04/28/2016 8:27 AM

## 2016-04-28 NOTE — ED Notes (Signed)
Denies SI/HI

## 2016-04-28 NOTE — Progress Notes (Addendum)
CSW met with Kendra Opitz with [facility]. She assessed patient in the ED and stated she would be able to accept patient at her facility in the morning (04-29-16) around 9:00am. Updated NP and Asst. Social Work Mudlogger.  Spoke with patient to inform him that he was accepted at [facility].  Attempted telephone call to patient's wife, Malike Foglio [phone] to inform her that patient will be going to [facility] in the morning around 9:00am. Unable to leave message, wireless customer unavailable.  Spoke with patient's wife, Buckley Bradly to inform her that patient would be going to [facility] tomorrow morning around 9:00am. CSW provided address and contact number for the wife.  [facility] [address] [place], [place] [address]   Genice Rouge [phone] ED CSW 04/28/2016 12:54 PM

## 2016-04-29 MED ORDER — TRAZODONE HCL 50 MG PO TABS: 50.0000 mg | ORAL_TABLET | Freq: Every evening | ORAL | Status: AC | PRN

## 2016-04-29 MED ORDER — CITALOPRAM HYDROBROMIDE 20 MG PO TABS: 20.0000 mg | ORAL_TABLET | Freq: Every day | ORAL | Status: AC

## 2016-04-29 MED ORDER — LEVETIRACETAM 500 MG PO TABS: 500.0000 mg | ORAL_TABLET | Freq: Two times a day (BID) | ORAL | Status: AC

## 2016-04-29 MED ORDER — TIMOLOL MALEATE 0.5 % OP SOLN: 1.0000 [drp] | Freq: Every day | OPHTHALMIC | Status: AC

## 2016-04-29 MED ORDER — PRAVASTATIN SODIUM 20 MG PO TABS: 20.0000 mg | ORAL_TABLET | Freq: Every day | ORAL | Status: AC

## 2016-04-29 MED ORDER — DONEPEZIL HCL 10 MG PO TABS: 10.0000 mg | ORAL_TABLET | Freq: Every day | ORAL | Status: AC

## 2016-04-29 MED ORDER — LORAZEPAM 0.5 MG PO TABS: 0.5000 mg | ORAL_TABLET | Freq: Every day | ORAL | Status: AC | PRN

## 2016-04-29 MED ORDER — AMLODIPINE BESYLATE 10 MG PO TABS: 10.0000 mg | ORAL_TABLET | Freq: Every day | ORAL | Status: AC

## 2016-04-29 MED ORDER — TRAVOPROST (BAK FREE) 0.004 % OP SOLN
1.0000 [drp] | Freq: Every day | OPHTHALMIC | Status: AC
Start: 2016-04-29 — End: ?

## 2016-04-29 MED ORDER — ASPIRIN EC 81 MG PO TBEC: 81.0000 mg | DELAYED_RELEASE_TABLET | Freq: Every day | ORAL | Status: AC

## 2016-04-29 NOTE — Progress Notes (Addendum)
Patient was informed again that he would be going to [facility] today. Patient stated he did not remember this information from yesterday. Information reiterated.   Patient was discharged with Jinny SandersBetty Davis with [facility]. Psychiatrist, Psychiatry Team, Nurse, NT, Asst. Social Work Interior and spatial designerDirector, and NP updated.  Elenore PaddyLaVonia Annalaya Wile, LCSWA [phone] ED CSW 04/29/2016 11:39 AM

## 2016-04-29 NOTE — ED Notes (Addendum)
When went in to do vitals on my pt. I smelled of urine,  I ask pt to get up to check  see if he had a accident.  He refused to get up.  My charge Nurse Kennyth ArnoldStacy said, " to document it."

## 2016-05-13 ENCOUNTER — Emergency Department (HOSPITAL_COMMUNITY)
Admission: EM | Admit: 2016-05-13 | Discharge: 2016-05-13 | Disposition: A | Discharge status: Home / Self Care | Payer: Medicare HMO | Attending: Emergency Medicine | Admitting: Emergency Medicine

## 2016-05-13 DIAGNOSIS — Z950 Presence of cardiac pacemaker: Secondary | ICD-10-CM | POA: Insufficient documentation

## 2016-05-13 DIAGNOSIS — N183 Chronic kidney disease, stage 3 (moderate): Secondary | ICD-10-CM | POA: Insufficient documentation

## 2016-05-13 DIAGNOSIS — F0390 Unspecified dementia without behavioral disturbance: Secondary | ICD-10-CM

## 2016-05-13 DIAGNOSIS — Z9181 History of falling: Secondary | ICD-10-CM

## 2016-05-13 DIAGNOSIS — Z048 Encounter for examination and observation for other specified reasons: Secondary | ICD-10-CM | POA: Diagnosis present

## 2016-05-13 DIAGNOSIS — K2970 Gastritis, unspecified, without bleeding: Secondary | ICD-10-CM | POA: Diagnosis not present

## 2016-05-13 DIAGNOSIS — Z7982 Long term (current) use of aspirin: Secondary | ICD-10-CM | POA: Insufficient documentation

## 2016-05-13 DIAGNOSIS — Z79899 Other long term (current) drug therapy: Secondary | ICD-10-CM | POA: Insufficient documentation

## 2016-05-13 DIAGNOSIS — R110 Nausea: Secondary | ICD-10-CM | POA: Diagnosis not present

## 2016-05-13 DIAGNOSIS — I129 Hypertensive chronic kidney disease with stage 1 through stage 4 chronic kidney disease, or unspecified chronic kidney disease: Secondary | ICD-10-CM | POA: Diagnosis not present

## 2016-05-13 NOTE — ED Notes (Addendum)
Wife called to alert to possible patient in route. Patient lives at home with a home health aid, was found outside in the driveway on the ground. The aid did not know how the patient got there. Wife states she doesn't want him going back and is requesting consult with either social work or case management.   Wesley JewelBarbara Budnick (wife) - [phone]

## 2016-05-13 NOTE — ED Notes (Addendum)
Patient was picked up from group home, where he stays during the day due to dementia. EMS called out for fall. Patient was laying supine on the driveway. Bystanders state he was sitting down and then laid back. Patient denies falling, hitting head, loss of consciousness. Patient is complaining of RLQ abdominal pain. Patient had an episode of diarrhea with EMS. 4 mg zofran IV and 500 cc en route.

## 2016-05-13 NOTE — ED Notes (Signed)
Attempted to call the wife to let her know the facility [facility] would be coming to pick up patient. No answer.

## 2016-05-13 NOTE — Progress Notes (Signed)
ED SW discussed pt return to ED with ED CM  ED CM spoke with ED charge RN to discuss the concern of pt disposition

## 2016-05-13 NOTE — ED Notes (Addendum)
Spoke with Child psychotherapistsocial worker about the patient's case. Social worker stated the patient would need to be sent back to the group care home and the wife could seek placement from there. Social work spoke with group home, who stated they would accept the patient back to their facility. I called and spoke with the wife to update her on the situation. She said she would go "pick him up tonight and take him to [facility]."  Attempted to call report at the phone number given to me by the patient. No answer Informed patient's nurse of this. Received a different number from the social worker. Primary nurse is attempting to call report again at this time.

## 2016-05-13 NOTE — ED Notes (Signed)
PTAR notified of transportation need 

## 2016-05-13 NOTE — ED Notes (Addendum)
[facility] on scene at [facility]. I received a call from the social worker stating she had been in touch with [facility]. [facility]. The social worker, Joline MaxcyLavonya, said Mrs. Earlene PlaterDavis said she was told by the wife that since the wife didn't want the patient to go back to the nursing home that the wife didn't want Mrs. Earlene PlaterDavis to come pick up the patient. I informed Lavonya to have Mrs. Earlene PlaterDavis call me directly when she had the chance. I called the wife, Britta MccreedyBarbara, who said she hadn't spoken to Mrs. Earlene PlaterDavis all day and that it was okay if the patient came back via ambulance or van.   13:15 I began to send the patient back with the ambulance, since Mrs. Earlene PlaterDavis has been unable to be reached. Joline MaxcyLavonya called again to tell me that Mrs. Earlene PlaterDavis texted her to tell us that she was on her way with the group home [facility] to pick up the patient. Stated she would be here in 30-45 minutes. To save the family the unnecessary expense of PTAR transport, we'll hold the patient in the ED until the [facility] arrives to transport him back.

## 2016-05-13 NOTE — Progress Notes (Addendum)
CSW called and spoke with Mrs. Davis to obtain more information on [facility]'s [facility]. She stated her clients and staff were at the Day Program near [employer]. She stated patient was in sight of staff and clients. She stated patient was "joking around" and he collapsed in the driveway and 911 was called and patient was transported to the ED. She noted patient had been doing very well at her agency. She stated they like him there and they want him to come back. She stated patient's wife will not pay her for patient's stay.   CSW spoke with patient at bedside no family present. Patient reports he is not sure what happened. Patient reports "I was talking to a young lady in the cement driveway and the next thing I knew, I was on the ground". Patient reports "I know I got a ride by the ambulance". Patient did not recall any other information at this time. Patient has history of dementia per nurse's note.      CSW spoke with Mrs. Earlene PlaterDavis who states she spoke with patient's wife. She stated patient's wife no longer wants him there, therefore, she would not be picking up patient. CSW staffed with Charge RN and Asst. Social Work Interior and spatial designerDirector.   1:09pm- CSW called Mrs. Earlene PlaterDavis back and she stated she would pick up patient around thirty to forty-five minutes. She stated patient's wife has stated she no longer wants patient at her agency. She stated patient's wife stated she has a place for patient to go on today.  Spoke with patient's wife who states she does not want patient at [facility] any longer. She stated she has contacted [facility] for patient to go there today. She stated she would need an FL2 completed. CSW informed her that a consult would need to be made with supervisor to see if that would be possible.  Patient was picked up today by Jinny SandersBetty Davis from the ER around 2:00pm. Charge RN and Asst. Social Work Interior and spatial designerDirector made aware. CSW informed from Asst. Social Work Interior and spatial designerDirector that [employer]2  would need to be facilitated by patient's PCP. Call made to inform patient's wife to inform her that FL2 would need to be completed by the PCP. She stated he does not have a PCP. Asst. Social Work Interior and spatial designerDirector informed.   Elenore PaddyLaVonia Janeli Lewison, LCSWA [phone] ED CSW 05/13/2016 2:26 PM

## 2016-05-13 NOTE — Discharge Instructions (Signed)
It was our pleasure to provide your ER care today.  Follow up with your primary care doctor in the coming week.  Return to ER if worse, new symptoms, severe pain, fevers, or other medical emergency.     Fall Prevention in the Home  Falls can cause injuries and can affect people from all age groups. There are many simple things that you can do to make your home safe and to help prevent falls. WHAT CAN I DO ON THE OUTSIDE OF MY HOME?  Regularly repair the edges of walkways and driveways and fix any cracks.  Remove high doorway thresholds.  Trim any shrubbery on the main path into your home.  Use bright outdoor lighting.  Clear walkways of debris and clutter, including tools and rocks.  Regularly check that handrails are securely fastened and in good repair. Both sides of any steps should have handrails.  Install guardrails along the edges of any raised decks or porches.  Have leaves, snow, and ice cleared regularly.  Use sand or salt on walkways during winter months.  In the garage, clean up any spills right away, including grease or oil spills. WHAT CAN I DO IN THE BATHROOM?  Use night lights.  Install grab bars by the toilet and in the tub and shower. Do not use towel bars as grab bars.  Use non-skid mats or decals on the floor of the tub or shower.  If you need to sit down while you are in the shower, use a plastic, non-slip stool.Marland Kitchen  Keep the floor dry. Immediately clean up any water that spills on the floor.  Remove soap buildup in the tub or shower on a regular basis.  Attach bath mats securely with double-sided non-slip rug tape.  Remove throw rugs and other tripping hazards from the floor. WHAT CAN I DO IN THE BEDROOM?  Use night lights.  Make sure that a bedside light is easy to reach.  Do not use oversized bedding that drapes onto the floor.  Have a firm chair that has side arms to use for getting dressed.  Remove throw rugs and other tripping hazards  from the floor. WHAT CAN I DO IN THE KITCHEN?   Clean up any spills right away.  Avoid walking on wet floors.  Place frequently used items in easy-to-reach places.  If you need to reach for something above you, use a sturdy step stool that has a grab bar.  Keep electrical cables out of the way.  Do not use floor polish or wax that makes floors slippery. If you have to use wax, make sure that it is non-skid floor wax.  Remove throw rugs and other tripping hazards from the floor. WHAT CAN I DO IN THE STAIRWAYS?  Do not leave any items on the stairs.  Make sure that there are handrails on both sides of the stairs. Fix handrails that are broken or loose. Make sure that handrails are as long as the stairways.  Check any carpeting to make sure that it is firmly attached to the stairs. Fix any carpet that is loose or worn.  Avoid having throw rugs at the top or bottom of stairways, or secure the rugs with carpet tape to prevent them from moving.  Make sure that you have a light switch at the top of the stairs and the bottom of the stairs. If you do not have them, have them installed. WHAT ARE SOME OTHER FALL PREVENTION TIPS?  Wear closed-toe shoes that fit  well and support your feet. Wear shoes that have rubber soles or low heels.  When you use a stepladder, make sure that it is completely opened and that the sides are firmly locked. Have someone hold the ladder while you are using it. Do not climb a closed stepladder.  Add color or contrast paint or tape to grab bars and handrails in your home. Place contrasting color strips on the first and last steps.  Use mobility aids as needed, such as canes, walkers, scooters, and crutches.  Turn on lights if it is dark. Replace any light bulbs that burn out.  Set up furniture so that there are clear paths. Keep the furniture in the same spot.  Fix any uneven floor surfaces.  Choose a carpet design that does not hide the edge of steps of a  stairway.  Be aware of any and all pets.  Review your medicines with your healthcare provider. Some medicines can cause dizziness or changes in blood pressure, which increase your risk of falling. Talk with your health care provider about other ways that you can decrease your risk of falls. This may include working with a physical therapist or trainer to improve your strength, balance, and endurance.   This information is not intended to replace advice given to you by your health care provider. Make sure you discuss any questions you have with your health care provider.   Document Released: 10/16/2002 Document Revised: 03/12/2015 Document Reviewed: 11/30/2014 Elsevier Interactive Patient Education 2016 Elsevier Inc.     Dementia Dementia is a general term for problems with brain function. A person with dementia has memory loss and a hard time with at least one other brain function such as thinking, speaking, or problem solving. Dementia can affect social functioning, how you do your job, your mood, or your personality. The changes may be hidden for a long time. The earliest forms of this disease are usually not detected by family or friends. Dementia can be:  Irreversible.  Potentially reversible.  Partially reversible.  Progressive. This means it can get worse over time. CAUSES  Irreversible dementia causes may include:  Degeneration of brain cells (Alzheimer disease or Lewy body dementia).  Multiple small strokes (vascular dementia).  Infection (chronic meningitis or Creutzfeldt-Jakob disease).  Frontotemporal dementia. This affects younger people, age 9 to 24, compared to those who have Alzheimer disease.  Dementia associated with other disorders like Parkinson disease, Huntington disease, or HIV-associated dementia. Potentially or partially reversible dementia causes may include:  Medicines.  Metabolic causes such as excessive alcohol intake, vitamin B12 deficiency, or  thyroid disease.  Masses or pressure in the brain such as a tumor, blood clot, or hydrocephalus. SIGNS AND SYMPTOMS  Symptoms are often hard to detect. Family members or coworkers may not notice them early in the disease process. Different people with dementia may have different symptoms. Symptoms can include:  A hard time with memory, especially recent memory. Long-term memory may not be impaired.  Asking the same question multiple times or forgetting something someone just said.  A hard time speaking your thoughts or finding certain words.  A hard time solving problems or performing familiar tasks (such as how to use a telephone).  Sudden changes in mood.  Changes in personality, especially increasing moodiness or mistrust.  Depression.  A hard time understanding complex ideas that were never a problem in the past. DIAGNOSIS  There are no specific tests for dementia.   Your health care provider may recommend a  thorough evaluation. This is because some forms of dementia can be reversible. The evaluation will likely include a physical exam and getting a detailed history from you and a family member. The history often gives the best clues and suggestions for a diagnosis.  Memory testing may be done. A detailed brain function evaluation called neuropsychologic testing may be helpful.  Lab tests and brain imaging (such as a CT scan or MRI scan) are sometimes important.  Sometimes observation and re-evaluation over time is very helpful. TREATMENT  Treatment depends on the cause.   If the problem is a vitamin deficiency, it may be helped or cured with supplements.  For dementias such as Alzheimer disease, medicines are available to stabilize or slow the course of the disease. There are no cures for this type of dementia.  Your health care provider can help direct you to groups, organizations, and other health care providers to help with decisions in the care of you or your loved  one. HOME CARE INSTRUCTIONS The care of individuals with dementia is varied and dependent upon the progression of the dementia. The following suggestions are intended for the person living with, or caring for, the person with dementia.  Create a safe environment.  Remove the locks on bathroom doors to prevent the person from accidentally locking himself or herself in.  Use childproof latches on kitchen cabinets and any place where cleaning supplies, chemicals, or alcohol are kept.  Use childproof covers in unused electrical outlets.  Install childproof devices to keep doors and windows secured.  Remove stove knobs or install safety knobs and an automatic shut-off on the stove.  Lower the temperature on water heaters.  Label medicines and keep them locked up.  Secure knives, lighters, matches, power tools, and guns, and keep these items out of reach.  Keep the house free from clutter. Remove rugs or anything that might contribute to a fall.  Remove objects that might break and hurt the person.  Make sure lighting is good, both inside and outside.  Install grab rails as needed.  Use a monitoring device to alert you to falls or other needs for help.  Reduce confusion.  Keep familiar objects and people around.  Use night lights or dim lights at night.  Label items or areas.  Use reminders, notes, or directions for daily activities or tasks.  Keep a simple, consistent routine for waking, meals, bathing, dressing, and bedtime.  Create a calm, quiet environment.  Place large clocks and calendars prominently.  Display emergency numbers and home address near all telephones.  Use cues to establish different times of the day. An example is to open curtains to let the natural light in during the day.   Use effective communication.  Choose simple words and short sentences.  Use a gentle, calm tone of voice.  Be careful not to interrupt.  If the person is struggling to  find a word or communicate a thought, try to provide the word or thought.  Ask one question at a time. Allow the person ample time to answer questions. Repeat the question again if the person does not respond.  Reduce nighttime restlessness.  Provide a comfortable bed.  Have a consistent nighttime routine.  Ensure a regular walking or physical activity schedule. Involve the person in daily activities as much as possible.  Limit napping during the day.  Limit caffeine.  Attend social events that stimulate rather than overwhelm the senses.  Encourage good nutrition and hydration.  Reduce  distractions during meal times and snacks.  Avoid foods that are too hot or too cold.  Monitor chewing and swallowing ability.  Continue with routine vision, hearing, dental, and medical screenings.  Give medicines only as directed by the health care provider.  Monitor driving abilities. Do not allow the person to drive when safe driving is no longer possible.  Register with an identification program which could provide location assistance in the event of a missing person situation. SEEK MEDICAL CARE IF:   New behavioral problems start such as moodiness, aggressiveness, or seeing things that are not there (hallucinations).  Any new problem with brain function happens. This includes problems with balance, speech, or falling a lot.  Problems with swallowing develop.  Any symptoms of other illness happen. Small changes or worsening in any aspect of brain function can be a sign that the illness is getting worse. It can also be a sign of another medical illness such as infection. Seeing a health care provider right away is important. SEEK IMMEDIATE MEDICAL CARE IF:   A fever develops.  New or worsened confusion develops.  New or worsened sleepiness develops.  Staying awake becomes hard to do.   This information is not intended to replace advice given to you by your health care provider.  Make sure you discuss any questions you have with your health care provider.   Document Released: 04/21/2001 Document Revised: 11/16/2014 Document Reviewed: 03/23/2011 Elsevier Interactive Patient Education [employer]2016 Elsevier Inc.

## 2016-05-13 NOTE — ED Notes (Addendum)
Attempted report to call report to Ms. Earlene PlaterDavis [phone] at group home. No answer.

## 2016-05-13 NOTE — ED Notes (Addendum)
Patient's wife called to check and see if the patient was still in the ER. Informed her that he was currently, but that the ambulance was on their way to take him to the group home. She said her daughter could come get the patient at 17:00 tonight, but that it was okay if he went back via ambulance. Wife had also ask that we look through lost and found for belongings that were lost from his previous visit. We searched for those items in lost and found and in our back lockers with no results. Notified wife, who thanked us for looking.

## 2016-05-13 NOTE — ED Notes (Signed)
Patient ambulated to bathroom and back.

## 2016-05-13 NOTE — ED Notes (Signed)
Patient given a cup of coffee. 

## 2016-05-13 NOTE — ED Notes (Signed)
Bed: WA17 Expected date:  Expected time:  Means of arrival:  Comments: EMS - 1580 M - Fall/abd pain/dementia - 17

## 2016-05-13 NOTE — ED Notes (Signed)
Patient's wife called at states she would like patient sent to [facility], instead of prior residence. Social worker contacted and stated they will call wife.

## 2016-05-13 NOTE — ED Notes (Signed)
Mrs. Earlene PlaterDavis alerted social worker that they were currently on their way here.

## 2016-05-13 NOTE — ED Provider Notes (Signed)
CSN: [id number]     Arrival date & time 05/13/16  1029 History   First MD Initiated Contact with Patient 05/13/16 1036     Chief Complaint  Patient presents with  . Fall  . Dementia     (Consider location/radiation/quality/duration/timing/severity/associated sxs/prior Treatment) Patient is a 82 y.o. male presenting with fall. The history is provided by the patient and the EMS personnel. The history is limited by the condition of the patient.  Fall Pertinent negatives include no chest pain, no abdominal pain, no headaches and no shortness of breath.  Patient arrives via ems, call in reference to possible fall at adult day center.  Patient w hx dementia. On arrival to ED, patient states he feels fine.  EMS reports some bystanders noted that patient did not actually fall, but sat down on ground, and then laid down.  Patient indicates he is hungry, and that he hasnt eaten breakfast yet today. Denies feeling faint or dizzy. No headache. No neck or back pain. No chest pain or sob. No abd pain. Patient denies vomiting. No gu c/o. No fever or chills. Patient reaffirms that he feels fine.      Past Medical History  Diagnosis Date  . Hypertension   . Sinus node dysfunction (HCC)     a. s/p MDT dual chamber pacemaker implanted by Dr Amil AmenEdmunds  . CKD (chronic kidney disease), stage III   . Pacemaker   . Headache   . Alzheimer's dementia   . Thrombocytopenia [facility](HCC)    Past Surgical History  Procedure Laterality Date  . Pacemaker insertion  2007    MDT dual chamber pacemaker implanted for sinus node dysfunction by Dr Ty HiltsEdmonds  . Cardiac catheterization  2007    normal coronaries  . Ep implantable device N/A 09/17/2015    Procedure:  PPM Generator Changeout;  Surgeon: Hillis RangeJames Allred, MD;  Location: [facility];  Service: Cardiovascular;  Laterality: N/A;   Family History  Problem Relation Age of Onset  . Cancer Father    Social History  Substance Use Topics  . Smoking status: Never  Smoker   . Smokeless tobacco: Never Used  . Alcohol Use: No    Review of Systems  Constitutional: Negative for fever and chills.  HENT: Negative for sore throat.   Eyes: Negative for visual disturbance.  Respiratory: Negative for shortness of breath.   Cardiovascular: Negative for chest pain.  Gastrointestinal: Negative for nausea, vomiting and abdominal pain.  Genitourinary: Negative for dysuria and flank pain.  Musculoskeletal: Negative for back pain and neck pain.  Skin: Negative for rash.  Neurological: Negative for headaches.  Hematological: Does not bruise/bleed easily.  Psychiatric/Behavioral: Negative for agitation.      Allergies  Review of patient's allergies indicates no known allergies.  Home Medications   Prior to Admission medications   Medication Sig Start Date End Date Taking? Authorizing Provider  acetaminophen (TYLENOL) 500 MG tablet Take 500 mg by mouth every 4 (four) hours as needed for mild pain, moderate pain or fever.     Historical Provider, MD  alum & mag hydroxide-simeth (MAALOX/MYLANTA) 200-200-20 MG/5ML suspension Take 30 mLs by mouth every 6 (six) hours as needed for indigestion or heartburn.    Historical Provider, MD  amLODipine (NORVASC) 10 MG tablet Take 1 tablet (10 mg total) by mouth daily. 04/29/16   Charm RingsJamison Y Lord, NP  aspirin EC 81 MG tablet Take 1 tablet (81 mg total) by mouth daily. 04/29/16   Charm RingsJamison Y Lord, NP  citalopram (CELEXA) 20 MG tablet Take 1 tablet (20 mg total) by mouth daily. 04/29/16   Charm RingsJamison Y Lord, NP  donepezil (ARICEPT) 10 MG tablet Take 1 tablet (10 mg total) by mouth daily. 04/29/16   Charm RingsJamison Y Lord, NP  levETIRAcetam (KEPPRA) 500 MG tablet Take 1 tablet (500 mg total) by mouth 2 (two) times daily. 04/29/16   Charm RingsJamison Y Lord, NP  loperamide (IMODIUM) 2 MG capsule Take 2 mg by mouth daily as needed for diarrhea or loose stools.     Historical Provider, MD  LORazepam (ATIVAN) 0.5 MG tablet Take 1 tablet (0.5 mg total) by mouth  daily as needed for anxiety (agitation). 04/29/16   Charm RingsJamison Y Lord, NP  magnesium hydroxide (MILK OF MAGNESIA) 400 MG/5ML suspension Take 30 mLs by mouth at bedtime as needed for mild constipation.    Historical Provider, MD  pravastatin (PRAVACHOL) 20 MG tablet Take 1 tablet (20 mg total) by mouth daily. 04/29/16   Charm RingsJamison Y Lord, NP  timolol (TIMOPTIC) 0.5 % ophthalmic solution Place 1 drop into both eyes daily. 04/29/16   Charm RingsJamison Y Lord, NP  Travoprost, BAK Free, (TRAVATAN) 0.004 % SOLN ophthalmic solution Place 1 drop into both eyes at bedtime. 04/29/16   Charm RingsJamison Y Lord, NP  traZODone (DESYREL) 50 MG tablet Take 1 tablet (50 mg total) by mouth at bedtime as needed for sleep (agitation). 04/29/16   Charm RingsJamison Y Lord, NP   BP 143/95 mmHg  Pulse 85  Temp(Src) 97.5 F (36.4 C) (Oral)  Resp 16  Ht 5\' 9"  (1.753 m)  Wt 80.74 kg  BMI 26.27 kg/m2  SpO2 99% Physical Exam  Constitutional: He appears well-developed and well-nourished. No distress.  HENT:  Head: Atraumatic.  Mouth/Throat: Oropharynx is clear and moist.  Eyes: Conjunctivae are normal. Pupils are equal, round, and reactive to light. No scleral icterus.  Neck: Neck supple. No tracheal deviation present.  Cardiovascular: Normal rate, regular rhythm, normal heart sounds and intact distal pulses.   Pulmonary/Chest: Effort normal and breath sounds normal. No accessory muscle usage. No respiratory distress. He exhibits no tenderness.  Abdominal: Soft. Bowel sounds are normal. He exhibits no distension. There is no tenderness.  Genitourinary:  No cva tenderness  Musculoskeletal: Normal range of motion. He exhibits no edema or tenderness.  CTLS spine, non tender, aligned, no step off. Good rom bil ext without pain or focal bony tenderness.   Neurological: He is alert.  Alert, content appearing. Speech clear/fluent. Motor intact bil. stre 5/5.   Skin: Skin is warm and dry. No rash noted. He is not diaphoretic.  Psychiatric: He has a normal  mood and affect.  Nursing note and vitals reviewed.   ED Course  Procedures (including critical care time)     MDM   Monitor.  Reviewed nursing notes and prior charts for additional history.   Patient continues to deny any c/o, denies any pain.   Po fluids. Ambulate w assist.   Patient remains asymptomatic, and currently appears stable for d/c.  Recheck, no new c/o, patient indicates he feels at baseline, no c/o, no pain.     Cathren LaineKevin Fonda Rochon, MD 05/13/16 1149

## 2016-05-13 NOTE — ED Notes (Addendum)
[facility] discharged with [facility]. Discharge instructions reviewed with Wesley Cook. Mrs. Wesley Cook verbalized understanding.

## 2016-05-14 DIAGNOSIS — R0602 Shortness of breath: Secondary | ICD-10-CM | POA: Diagnosis not present

## 2016-05-14 DIAGNOSIS — I10 Essential (primary) hypertension: Secondary | ICD-10-CM | POA: Diagnosis not present

## 2016-05-14 DIAGNOSIS — E559 Vitamin D deficiency, unspecified: Secondary | ICD-10-CM | POA: Diagnosis not present

## 2016-05-14 DIAGNOSIS — Z0000 Encounter for general adult medical examination without abnormal findings: Secondary | ICD-10-CM | POA: Diagnosis not present

## 2016-05-14 DIAGNOSIS — R5383 Other fatigue: Secondary | ICD-10-CM | POA: Diagnosis not present

## 2016-05-14 DIAGNOSIS — E782 Mixed hyperlipidemia: Secondary | ICD-10-CM | POA: Diagnosis not present

## 2016-05-18 DIAGNOSIS — R32 Unspecified urinary incontinence: Secondary | ICD-10-CM | POA: Diagnosis not present

## 2016-06-03 DIAGNOSIS — H25813 Combined forms of age-related cataract, bilateral: Secondary | ICD-10-CM | POA: Diagnosis not present

## 2016-06-11 DIAGNOSIS — G309 Alzheimer's disease, unspecified: Secondary | ICD-10-CM | POA: Diagnosis not present

## 2016-06-12 DIAGNOSIS — G309 Alzheimer's disease, unspecified: Secondary | ICD-10-CM | POA: Diagnosis not present

## 2016-06-13 DIAGNOSIS — G309 Alzheimer's disease, unspecified: Secondary | ICD-10-CM | POA: Diagnosis not present

## 2016-06-14 DIAGNOSIS — G309 Alzheimer's disease, unspecified: Secondary | ICD-10-CM | POA: Diagnosis not present

## 2016-06-15 DIAGNOSIS — G309 Alzheimer's disease, unspecified: Secondary | ICD-10-CM | POA: Diagnosis not present

## 2016-06-16 DIAGNOSIS — R269 Unspecified abnormalities of gait and mobility: Secondary | ICD-10-CM | POA: Diagnosis not present

## 2016-06-16 DIAGNOSIS — G309 Alzheimer's disease, unspecified: Secondary | ICD-10-CM | POA: Diagnosis not present

## 2016-06-16 DIAGNOSIS — Z950 Presence of cardiac pacemaker: Secondary | ICD-10-CM | POA: Diagnosis not present

## 2016-06-16 DIAGNOSIS — W07XXXA Fall from chair, initial encounter: Secondary | ICD-10-CM | POA: Diagnosis not present

## 2016-06-16 DIAGNOSIS — R1110 Vomiting, unspecified: Secondary | ICD-10-CM | POA: Diagnosis not present

## 2016-06-16 DIAGNOSIS — R69 Illness, unspecified: Secondary | ICD-10-CM | POA: Diagnosis not present

## 2016-06-16 DIAGNOSIS — Z7982 Long term (current) use of aspirin: Secondary | ICD-10-CM | POA: Diagnosis not present

## 2016-06-16 DIAGNOSIS — R404 Transient alteration of awareness: Secondary | ICD-10-CM | POA: Diagnosis not present

## 2016-06-16 DIAGNOSIS — I10 Essential (primary) hypertension: Secondary | ICD-10-CM | POA: Diagnosis not present

## 2016-06-16 DIAGNOSIS — E785 Hyperlipidemia, unspecified: Secondary | ICD-10-CM | POA: Diagnosis not present

## 2016-06-16 DIAGNOSIS — S0003XA Contusion of scalp, initial encounter: Secondary | ICD-10-CM | POA: Diagnosis not present

## 2016-06-16 DIAGNOSIS — R531 Weakness: Secondary | ICD-10-CM | POA: Diagnosis not present

## 2016-06-17 DIAGNOSIS — G309 Alzheimer's disease, unspecified: Secondary | ICD-10-CM | POA: Diagnosis not present

## 2016-06-18 DIAGNOSIS — G309 Alzheimer's disease, unspecified: Secondary | ICD-10-CM | POA: Diagnosis not present

## 2016-06-19 DIAGNOSIS — G309 Alzheimer's disease, unspecified: Secondary | ICD-10-CM | POA: Diagnosis not present

## 2016-06-20 DIAGNOSIS — G309 Alzheimer's disease, unspecified: Secondary | ICD-10-CM | POA: Diagnosis not present

## 2016-06-21 DIAGNOSIS — G309 Alzheimer's disease, unspecified: Secondary | ICD-10-CM | POA: Diagnosis not present

## 2016-06-22 DIAGNOSIS — G309 Alzheimer's disease, unspecified: Secondary | ICD-10-CM | POA: Diagnosis not present

## 2016-06-23 DIAGNOSIS — G309 Alzheimer's disease, unspecified: Secondary | ICD-10-CM | POA: Diagnosis not present

## 2016-06-24 DIAGNOSIS — R1110 Vomiting, unspecified: Secondary | ICD-10-CM | POA: Diagnosis not present

## 2016-06-24 DIAGNOSIS — E785 Hyperlipidemia, unspecified: Secondary | ICD-10-CM | POA: Diagnosis not present

## 2016-06-24 DIAGNOSIS — R402431 Glasgow coma scale score 3-8, in the field [EMT or ambulance]: Secondary | ICD-10-CM | POA: Diagnosis not present

## 2016-06-24 DIAGNOSIS — N281 Cyst of kidney, acquired: Secondary | ICD-10-CM | POA: Diagnosis not present

## 2016-06-24 DIAGNOSIS — Z7982 Long term (current) use of aspirin: Secondary | ICD-10-CM | POA: Diagnosis not present

## 2016-06-24 DIAGNOSIS — R69 Illness, unspecified: Secondary | ICD-10-CM | POA: Diagnosis not present

## 2016-06-24 DIAGNOSIS — Z79899 Other long term (current) drug therapy: Secondary | ICD-10-CM | POA: Diagnosis not present

## 2016-06-24 DIAGNOSIS — R112 Nausea with vomiting, unspecified: Secondary | ICD-10-CM | POA: Diagnosis not present

## 2016-06-24 DIAGNOSIS — R1111 Vomiting without nausea: Secondary | ICD-10-CM | POA: Diagnosis not present

## 2016-06-24 DIAGNOSIS — R569 Unspecified convulsions: Secondary | ICD-10-CM | POA: Diagnosis not present

## 2016-06-24 DIAGNOSIS — J811 Chronic pulmonary edema: Secondary | ICD-10-CM | POA: Diagnosis not present

## 2016-06-24 DIAGNOSIS — G309 Alzheimer's disease, unspecified: Secondary | ICD-10-CM | POA: Diagnosis not present

## 2016-06-24 DIAGNOSIS — I10 Essential (primary) hypertension: Secondary | ICD-10-CM | POA: Diagnosis not present

## 2016-06-24 DIAGNOSIS — R4182 Altered mental status, unspecified: Secondary | ICD-10-CM | POA: Diagnosis not present

## 2016-06-25 DIAGNOSIS — G309 Alzheimer's disease, unspecified: Secondary | ICD-10-CM | POA: Diagnosis not present

## 2016-06-26 DIAGNOSIS — G309 Alzheimer's disease, unspecified: Secondary | ICD-10-CM | POA: Diagnosis not present

## 2016-06-27 DIAGNOSIS — G309 Alzheimer's disease, unspecified: Secondary | ICD-10-CM | POA: Diagnosis not present

## 2016-06-28 DIAGNOSIS — G309 Alzheimer's disease, unspecified: Secondary | ICD-10-CM | POA: Diagnosis not present

## 2016-06-29 DIAGNOSIS — G309 Alzheimer's disease, unspecified: Secondary | ICD-10-CM | POA: Diagnosis not present

## 2016-06-30 DIAGNOSIS — G309 Alzheimer's disease, unspecified: Secondary | ICD-10-CM | POA: Diagnosis not present

## 2016-07-01 DIAGNOSIS — G309 Alzheimer's disease, unspecified: Secondary | ICD-10-CM | POA: Diagnosis not present

## 2016-07-02 DIAGNOSIS — Q845 Enlarged and hypertrophic nails: Secondary | ICD-10-CM | POA: Diagnosis not present

## 2016-07-02 DIAGNOSIS — B351 Tinea unguium: Secondary | ICD-10-CM | POA: Diagnosis not present

## 2016-07-02 DIAGNOSIS — I739 Peripheral vascular disease, unspecified: Secondary | ICD-10-CM | POA: Diagnosis not present

## 2016-07-02 DIAGNOSIS — L603 Nail dystrophy: Secondary | ICD-10-CM | POA: Diagnosis not present

## 2016-07-02 DIAGNOSIS — G309 Alzheimer's disease, unspecified: Secondary | ICD-10-CM | POA: Diagnosis not present

## 2016-07-02 DIAGNOSIS — M2010 Hallux valgus (acquired), unspecified foot: Secondary | ICD-10-CM | POA: Diagnosis not present

## 2016-07-02 DIAGNOSIS — L853 Xerosis cutis: Secondary | ICD-10-CM | POA: Diagnosis not present

## 2016-07-03 DIAGNOSIS — G309 Alzheimer's disease, unspecified: Secondary | ICD-10-CM | POA: Diagnosis not present

## 2016-07-04 DIAGNOSIS — G309 Alzheimer's disease, unspecified: Secondary | ICD-10-CM | POA: Diagnosis not present

## 2016-07-05 DIAGNOSIS — G309 Alzheimer's disease, unspecified: Secondary | ICD-10-CM | POA: Diagnosis not present

## 2016-07-06 DIAGNOSIS — R569 Unspecified convulsions: Secondary | ICD-10-CM | POA: Diagnosis not present

## 2016-07-06 DIAGNOSIS — H409 Unspecified glaucoma: Secondary | ICD-10-CM | POA: Diagnosis not present

## 2016-07-06 DIAGNOSIS — R69 Illness, unspecified: Secondary | ICD-10-CM | POA: Diagnosis not present

## 2016-07-06 DIAGNOSIS — G309 Alzheimer's disease, unspecified: Secondary | ICD-10-CM | POA: Diagnosis not present

## 2016-07-06 DIAGNOSIS — Z950 Presence of cardiac pacemaker: Secondary | ICD-10-CM | POA: Diagnosis not present

## 2016-07-06 DIAGNOSIS — I10 Essential (primary) hypertension: Secondary | ICD-10-CM | POA: Diagnosis not present

## 2016-07-06 DIAGNOSIS — E785 Hyperlipidemia, unspecified: Secondary | ICD-10-CM | POA: Diagnosis not present

## 2016-07-07 DIAGNOSIS — G309 Alzheimer's disease, unspecified: Secondary | ICD-10-CM | POA: Diagnosis not present

## 2016-07-08 DIAGNOSIS — G309 Alzheimer's disease, unspecified: Secondary | ICD-10-CM | POA: Diagnosis not present

## 2016-07-09 DIAGNOSIS — G309 Alzheimer's disease, unspecified: Secondary | ICD-10-CM | POA: Diagnosis not present

## 2016-07-09 IMAGING — DX DG CHEST 1V
1 series · 1 of 1 positions shown · non-contrast
Comparison: 04/10/2015

CLINICAL DATA: New onset confusion.

EXAM:
CHEST 1 VIEW

[chest ap]
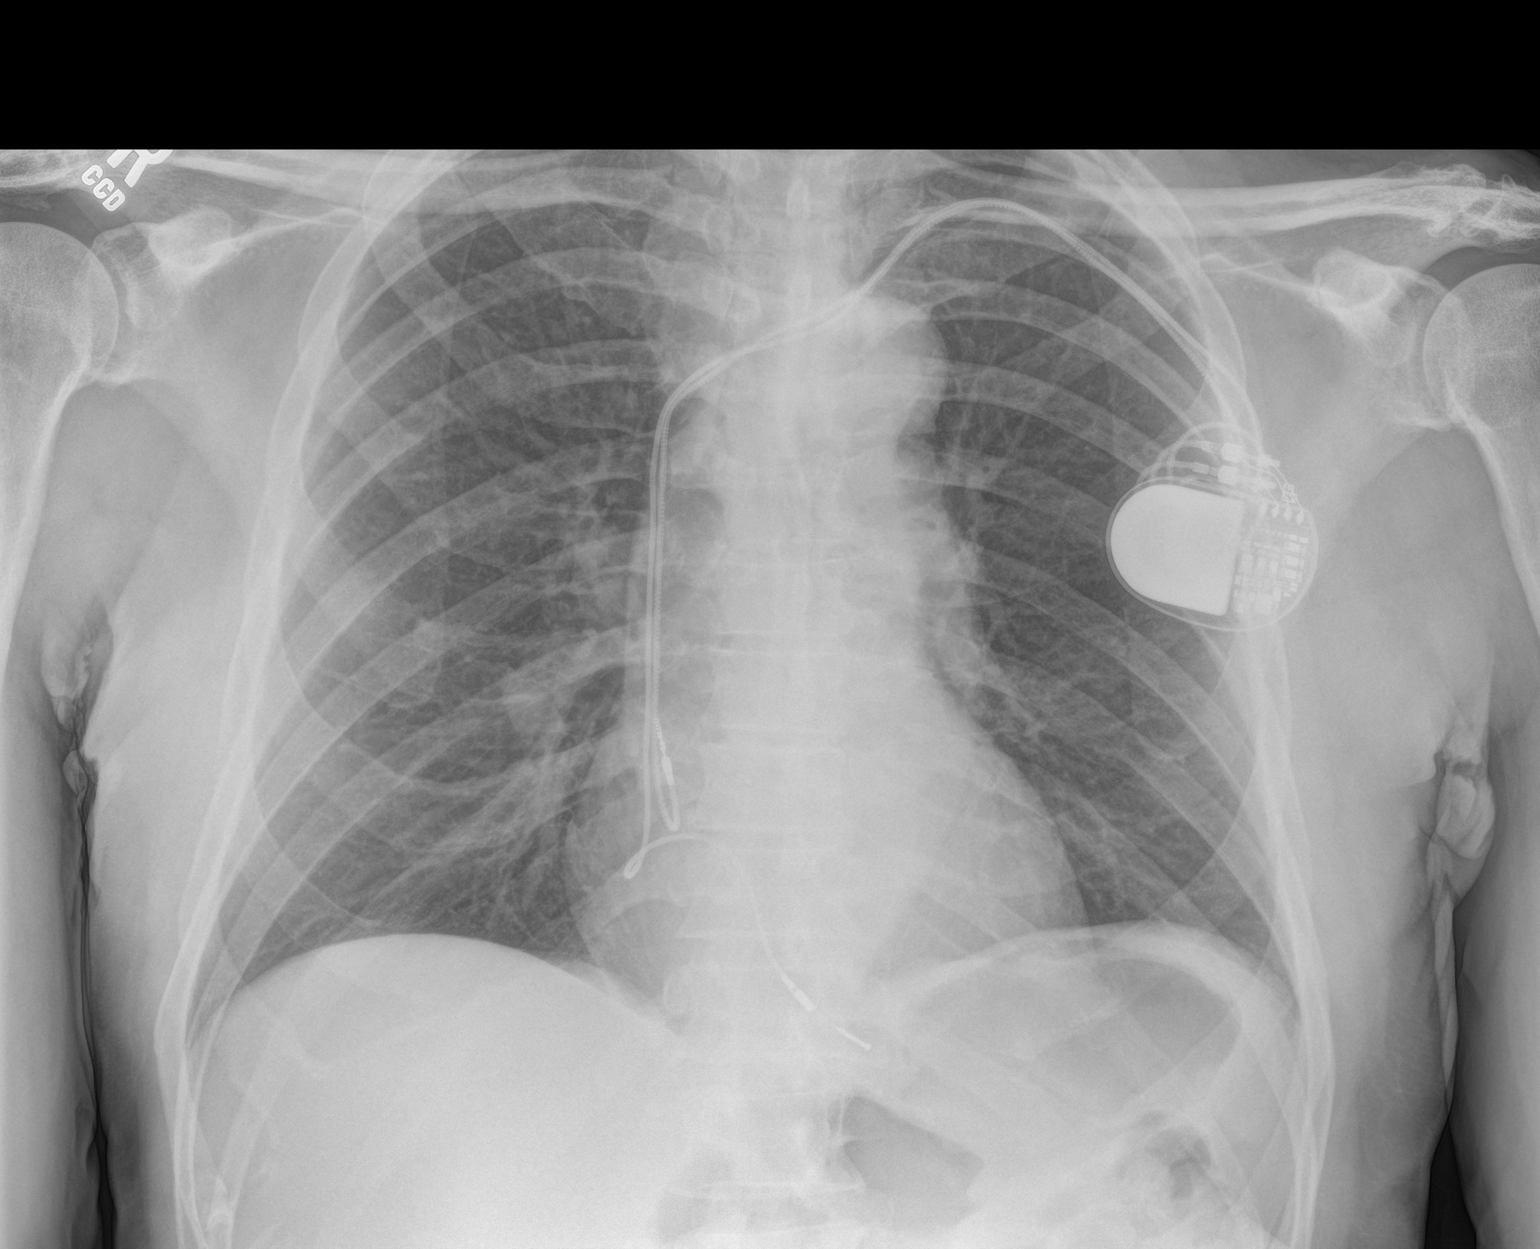

[1 of 1 positions shown; findings below may reference images not displayed]

FINDINGS: The pacer wires are stable. The cardiac silhouette, mediastinal and
hilar contours are within normal limits and unchanged. The lungs are
much better aerated with interval resolution of bibasilar
infiltrates or atelectasis. No pleural effusions or pneumothorax.
The bony thorax is intact.
IMPRESSION: No acute cardiopulmonary findings.  Resolution of prior findings.

## 2016-07-10 DIAGNOSIS — G309 Alzheimer's disease, unspecified: Secondary | ICD-10-CM | POA: Diagnosis not present

## 2016-07-11 DIAGNOSIS — G309 Alzheimer's disease, unspecified: Secondary | ICD-10-CM | POA: Diagnosis not present

## 2016-07-12 DIAGNOSIS — G309 Alzheimer's disease, unspecified: Secondary | ICD-10-CM | POA: Diagnosis not present

## 2016-07-13 DIAGNOSIS — G309 Alzheimer's disease, unspecified: Secondary | ICD-10-CM | POA: Diagnosis not present

## 2016-07-14 DIAGNOSIS — G309 Alzheimer's disease, unspecified: Secondary | ICD-10-CM | POA: Diagnosis not present

## 2016-07-15 DIAGNOSIS — G309 Alzheimer's disease, unspecified: Secondary | ICD-10-CM | POA: Diagnosis not present

## 2016-07-16 DIAGNOSIS — G309 Alzheimer's disease, unspecified: Secondary | ICD-10-CM | POA: Diagnosis not present

## 2016-07-17 DIAGNOSIS — G309 Alzheimer's disease, unspecified: Secondary | ICD-10-CM | POA: Diagnosis not present

## 2016-07-18 DIAGNOSIS — G309 Alzheimer's disease, unspecified: Secondary | ICD-10-CM | POA: Diagnosis not present

## 2016-07-19 DIAGNOSIS — G309 Alzheimer's disease, unspecified: Secondary | ICD-10-CM | POA: Diagnosis not present

## 2016-07-20 DIAGNOSIS — G309 Alzheimer's disease, unspecified: Secondary | ICD-10-CM | POA: Diagnosis not present

## 2016-07-21 DIAGNOSIS — G309 Alzheimer's disease, unspecified: Secondary | ICD-10-CM | POA: Diagnosis not present

## 2016-07-22 DIAGNOSIS — G309 Alzheimer's disease, unspecified: Secondary | ICD-10-CM | POA: Diagnosis not present

## 2016-07-23 DIAGNOSIS — G309 Alzheimer's disease, unspecified: Secondary | ICD-10-CM | POA: Diagnosis not present

## 2016-07-24 DIAGNOSIS — G309 Alzheimer's disease, unspecified: Secondary | ICD-10-CM | POA: Diagnosis not present

## 2016-07-25 DIAGNOSIS — G309 Alzheimer's disease, unspecified: Secondary | ICD-10-CM | POA: Diagnosis not present

## 2016-07-26 DIAGNOSIS — G309 Alzheimer's disease, unspecified: Secondary | ICD-10-CM | POA: Diagnosis not present

## 2016-07-27 DIAGNOSIS — G309 Alzheimer's disease, unspecified: Secondary | ICD-10-CM | POA: Diagnosis not present

## 2016-07-28 DIAGNOSIS — G309 Alzheimer's disease, unspecified: Secondary | ICD-10-CM | POA: Diagnosis not present

## 2016-07-29 DIAGNOSIS — G309 Alzheimer's disease, unspecified: Secondary | ICD-10-CM | POA: Diagnosis not present

## 2016-07-30 DIAGNOSIS — G309 Alzheimer's disease, unspecified: Secondary | ICD-10-CM | POA: Diagnosis not present

## 2016-07-31 DIAGNOSIS — G309 Alzheimer's disease, unspecified: Secondary | ICD-10-CM | POA: Diagnosis not present

## 2016-08-01 DIAGNOSIS — G309 Alzheimer's disease, unspecified: Secondary | ICD-10-CM | POA: Diagnosis not present

## 2016-08-04 DIAGNOSIS — G309 Alzheimer's disease, unspecified: Secondary | ICD-10-CM | POA: Diagnosis not present

## 2016-08-05 DIAGNOSIS — G309 Alzheimer's disease, unspecified: Secondary | ICD-10-CM | POA: Diagnosis not present

## 2016-08-06 DIAGNOSIS — G309 Alzheimer's disease, unspecified: Secondary | ICD-10-CM | POA: Diagnosis not present

## 2016-08-07 DIAGNOSIS — G309 Alzheimer's disease, unspecified: Secondary | ICD-10-CM | POA: Diagnosis not present

## 2016-08-08 DIAGNOSIS — G309 Alzheimer's disease, unspecified: Secondary | ICD-10-CM | POA: Diagnosis not present

## 2016-08-09 DIAGNOSIS — G309 Alzheimer's disease, unspecified: Secondary | ICD-10-CM | POA: Diagnosis not present

## 2016-08-10 DIAGNOSIS — G309 Alzheimer's disease, unspecified: Secondary | ICD-10-CM | POA: Diagnosis not present

## 2016-08-11 DIAGNOSIS — G309 Alzheimer's disease, unspecified: Secondary | ICD-10-CM | POA: Diagnosis not present

## 2016-08-12 DIAGNOSIS — G309 Alzheimer's disease, unspecified: Secondary | ICD-10-CM | POA: Diagnosis not present

## 2016-08-13 DIAGNOSIS — G309 Alzheimer's disease, unspecified: Secondary | ICD-10-CM | POA: Diagnosis not present

## 2016-08-14 DIAGNOSIS — G309 Alzheimer's disease, unspecified: Secondary | ICD-10-CM | POA: Diagnosis not present

## 2016-08-15 DIAGNOSIS — G309 Alzheimer's disease, unspecified: Secondary | ICD-10-CM | POA: Diagnosis not present

## 2016-08-16 DIAGNOSIS — G309 Alzheimer's disease, unspecified: Secondary | ICD-10-CM | POA: Diagnosis not present

## 2016-08-17 DIAGNOSIS — G309 Alzheimer's disease, unspecified: Secondary | ICD-10-CM | POA: Diagnosis not present

## 2016-08-19 DIAGNOSIS — G309 Alzheimer's disease, unspecified: Secondary | ICD-10-CM | POA: Diagnosis not present

## 2016-08-20 DIAGNOSIS — G309 Alzheimer's disease, unspecified: Secondary | ICD-10-CM | POA: Diagnosis not present

## 2016-08-21 DIAGNOSIS — G309 Alzheimer's disease, unspecified: Secondary | ICD-10-CM | POA: Diagnosis not present

## 2016-08-22 DIAGNOSIS — G309 Alzheimer's disease, unspecified: Secondary | ICD-10-CM | POA: Diagnosis not present

## 2016-08-23 DIAGNOSIS — G309 Alzheimer's disease, unspecified: Secondary | ICD-10-CM | POA: Diagnosis not present

## 2016-08-24 DIAGNOSIS — G309 Alzheimer's disease, unspecified: Secondary | ICD-10-CM | POA: Diagnosis not present

## 2016-08-26 DIAGNOSIS — G309 Alzheimer's disease, unspecified: Secondary | ICD-10-CM | POA: Diagnosis not present

## 2016-08-27 DIAGNOSIS — G309 Alzheimer's disease, unspecified: Secondary | ICD-10-CM | POA: Diagnosis not present

## 2016-08-28 DIAGNOSIS — G309 Alzheimer's disease, unspecified: Secondary | ICD-10-CM | POA: Diagnosis not present

## 2016-08-29 DIAGNOSIS — G309 Alzheimer's disease, unspecified: Secondary | ICD-10-CM | POA: Diagnosis not present

## 2016-08-30 DIAGNOSIS — G309 Alzheimer's disease, unspecified: Secondary | ICD-10-CM | POA: Diagnosis not present

## 2016-08-31 DIAGNOSIS — G309 Alzheimer's disease, unspecified: Secondary | ICD-10-CM | POA: Diagnosis not present

## 2016-09-05 DIAGNOSIS — S0990XA Unspecified injury of head, initial encounter: Secondary | ICD-10-CM | POA: Diagnosis not present

## 2016-09-05 DIAGNOSIS — Z950 Presence of cardiac pacemaker: Secondary | ICD-10-CM | POA: Diagnosis not present

## 2016-09-05 DIAGNOSIS — Z79899 Other long term (current) drug therapy: Secondary | ICD-10-CM | POA: Diagnosis not present

## 2016-09-05 DIAGNOSIS — W19XXXA Unspecified fall, initial encounter: Secondary | ICD-10-CM | POA: Diagnosis not present

## 2016-09-05 DIAGNOSIS — S0093XA Contusion of unspecified part of head, initial encounter: Secondary | ICD-10-CM | POA: Diagnosis not present

## 2016-09-05 DIAGNOSIS — R079 Chest pain, unspecified: Secondary | ICD-10-CM | POA: Diagnosis not present

## 2016-09-05 DIAGNOSIS — I10 Essential (primary) hypertension: Secondary | ICD-10-CM | POA: Diagnosis not present

## 2016-09-05 DIAGNOSIS — R69 Illness, unspecified: Secondary | ICD-10-CM | POA: Diagnosis not present

## 2016-09-05 DIAGNOSIS — M47812 Spondylosis without myelopathy or radiculopathy, cervical region: Secondary | ICD-10-CM | POA: Diagnosis not present

## 2016-09-05 DIAGNOSIS — H409 Unspecified glaucoma: Secondary | ICD-10-CM | POA: Diagnosis not present

## 2016-09-05 DIAGNOSIS — R269 Unspecified abnormalities of gait and mobility: Secondary | ICD-10-CM | POA: Diagnosis not present

## 2016-09-05 DIAGNOSIS — E785 Hyperlipidemia, unspecified: Secondary | ICD-10-CM | POA: Diagnosis not present

## 2016-09-05 DIAGNOSIS — G309 Alzheimer's disease, unspecified: Secondary | ICD-10-CM | POA: Diagnosis not present

## 2016-09-05 DIAGNOSIS — W1830XA Fall on same level, unspecified, initial encounter: Secondary | ICD-10-CM | POA: Diagnosis not present

## 2016-09-05 DIAGNOSIS — R6889 Other general symptoms and signs: Secondary | ICD-10-CM | POA: Diagnosis not present

## 2016-09-05 DIAGNOSIS — Z7982 Long term (current) use of aspirin: Secondary | ICD-10-CM | POA: Diagnosis not present

## 2016-09-05 DIAGNOSIS — M5030 Other cervical disc degeneration, unspecified cervical region: Secondary | ICD-10-CM | POA: Diagnosis not present

## 2016-09-05 DIAGNOSIS — R404 Transient alteration of awareness: Secondary | ICD-10-CM | POA: Diagnosis not present

## 2016-09-06 DIAGNOSIS — G309 Alzheimer's disease, unspecified: Secondary | ICD-10-CM | POA: Diagnosis not present

## 2016-09-07 DIAGNOSIS — G309 Alzheimer's disease, unspecified: Secondary | ICD-10-CM | POA: Diagnosis not present

## 2016-09-08 DIAGNOSIS — G309 Alzheimer's disease, unspecified: Secondary | ICD-10-CM | POA: Diagnosis not present

## 2016-09-09 DIAGNOSIS — G309 Alzheimer's disease, unspecified: Secondary | ICD-10-CM | POA: Diagnosis not present

## 2016-09-10 DIAGNOSIS — G309 Alzheimer's disease, unspecified: Secondary | ICD-10-CM | POA: Diagnosis not present

## 2016-09-11 DIAGNOSIS — G309 Alzheimer's disease, unspecified: Secondary | ICD-10-CM | POA: Diagnosis not present

## 2016-09-12 DIAGNOSIS — G309 Alzheimer's disease, unspecified: Secondary | ICD-10-CM | POA: Diagnosis not present

## 2016-09-13 DIAGNOSIS — G309 Alzheimer's disease, unspecified: Secondary | ICD-10-CM | POA: Diagnosis not present

## 2016-09-14 DIAGNOSIS — G309 Alzheimer's disease, unspecified: Secondary | ICD-10-CM | POA: Diagnosis not present

## 2016-09-20 DIAGNOSIS — E785 Hyperlipidemia, unspecified: Secondary | ICD-10-CM | POA: Diagnosis not present

## 2016-09-20 DIAGNOSIS — Z79899 Other long term (current) drug therapy: Secondary | ICD-10-CM | POA: Diagnosis not present

## 2016-09-20 DIAGNOSIS — G309 Alzheimer's disease, unspecified: Secondary | ICD-10-CM | POA: Diagnosis not present

## 2016-09-20 DIAGNOSIS — H409 Unspecified glaucoma: Secondary | ICD-10-CM | POA: Diagnosis not present

## 2016-09-20 DIAGNOSIS — I10 Essential (primary) hypertension: Secondary | ICD-10-CM | POA: Diagnosis not present

## 2016-09-20 DIAGNOSIS — R69 Illness, unspecified: Secondary | ICD-10-CM | POA: Diagnosis not present

## 2016-09-20 DIAGNOSIS — Z7982 Long term (current) use of aspirin: Secondary | ICD-10-CM | POA: Diagnosis not present

## 2016-09-20 DIAGNOSIS — Z950 Presence of cardiac pacemaker: Secondary | ICD-10-CM | POA: Diagnosis not present

## 2016-09-21 DIAGNOSIS — Z79899 Other long term (current) drug therapy: Secondary | ICD-10-CM | POA: Diagnosis not present

## 2016-09-21 DIAGNOSIS — E785 Hyperlipidemia, unspecified: Secondary | ICD-10-CM | POA: Diagnosis not present

## 2016-09-21 DIAGNOSIS — I10 Essential (primary) hypertension: Secondary | ICD-10-CM | POA: Diagnosis not present

## 2016-09-21 DIAGNOSIS — Z950 Presence of cardiac pacemaker: Secondary | ICD-10-CM | POA: Diagnosis not present

## 2016-09-21 DIAGNOSIS — R69 Illness, unspecified: Secondary | ICD-10-CM | POA: Diagnosis not present

## 2016-09-21 DIAGNOSIS — G309 Alzheimer's disease, unspecified: Secondary | ICD-10-CM | POA: Diagnosis not present

## 2016-09-21 DIAGNOSIS — H409 Unspecified glaucoma: Secondary | ICD-10-CM | POA: Diagnosis not present

## 2016-09-21 DIAGNOSIS — Z7982 Long term (current) use of aspirin: Secondary | ICD-10-CM | POA: Diagnosis not present

## 2016-09-22 DIAGNOSIS — H409 Unspecified glaucoma: Secondary | ICD-10-CM | POA: Diagnosis not present

## 2016-09-22 DIAGNOSIS — G309 Alzheimer's disease, unspecified: Secondary | ICD-10-CM | POA: Diagnosis not present

## 2016-09-22 DIAGNOSIS — E785 Hyperlipidemia, unspecified: Secondary | ICD-10-CM | POA: Diagnosis not present

## 2016-09-22 DIAGNOSIS — Z7982 Long term (current) use of aspirin: Secondary | ICD-10-CM | POA: Diagnosis not present

## 2016-09-22 DIAGNOSIS — Z79899 Other long term (current) drug therapy: Secondary | ICD-10-CM | POA: Diagnosis not present

## 2016-09-22 DIAGNOSIS — I10 Essential (primary) hypertension: Secondary | ICD-10-CM | POA: Diagnosis not present

## 2016-09-22 DIAGNOSIS — R69 Illness, unspecified: Secondary | ICD-10-CM | POA: Diagnosis not present

## 2016-09-22 DIAGNOSIS — Z950 Presence of cardiac pacemaker: Secondary | ICD-10-CM | POA: Diagnosis not present

## 2016-09-23 DIAGNOSIS — G309 Alzheimer's disease, unspecified: Secondary | ICD-10-CM | POA: Diagnosis not present

## 2016-09-24 DIAGNOSIS — R69 Illness, unspecified: Secondary | ICD-10-CM | POA: Diagnosis not present

## 2016-09-24 DIAGNOSIS — G4089 Other seizures: Secondary | ICD-10-CM | POA: Diagnosis not present

## 2016-09-24 DIAGNOSIS — G309 Alzheimer's disease, unspecified: Secondary | ICD-10-CM | POA: Diagnosis not present

## 2016-09-25 DIAGNOSIS — G309 Alzheimer's disease, unspecified: Secondary | ICD-10-CM | POA: Diagnosis not present

## 2016-09-26 DIAGNOSIS — G309 Alzheimer's disease, unspecified: Secondary | ICD-10-CM | POA: Diagnosis not present

## 2016-09-27 DIAGNOSIS — G309 Alzheimer's disease, unspecified: Secondary | ICD-10-CM | POA: Diagnosis not present

## 2016-09-28 DIAGNOSIS — G309 Alzheimer's disease, unspecified: Secondary | ICD-10-CM | POA: Diagnosis not present

## 2016-09-29 DIAGNOSIS — G309 Alzheimer's disease, unspecified: Secondary | ICD-10-CM | POA: Diagnosis not present

## 2016-09-30 DIAGNOSIS — G309 Alzheimer's disease, unspecified: Secondary | ICD-10-CM | POA: Diagnosis not present

## 2016-10-01 DIAGNOSIS — G309 Alzheimer's disease, unspecified: Secondary | ICD-10-CM | POA: Diagnosis not present

## 2016-10-02 DIAGNOSIS — G309 Alzheimer's disease, unspecified: Secondary | ICD-10-CM | POA: Diagnosis not present

## 2016-10-03 DIAGNOSIS — G309 Alzheimer's disease, unspecified: Secondary | ICD-10-CM | POA: Diagnosis not present

## 2016-10-04 DIAGNOSIS — G309 Alzheimer's disease, unspecified: Secondary | ICD-10-CM | POA: Diagnosis not present

## 2016-10-05 DIAGNOSIS — G309 Alzheimer's disease, unspecified: Secondary | ICD-10-CM | POA: Diagnosis not present

## 2016-10-06 DIAGNOSIS — G309 Alzheimer's disease, unspecified: Secondary | ICD-10-CM | POA: Diagnosis not present

## 2016-10-07 DIAGNOSIS — G309 Alzheimer's disease, unspecified: Secondary | ICD-10-CM | POA: Diagnosis not present

## 2016-10-08 DIAGNOSIS — G309 Alzheimer's disease, unspecified: Secondary | ICD-10-CM | POA: Diagnosis not present

## 2016-10-09 DIAGNOSIS — G309 Alzheimer's disease, unspecified: Secondary | ICD-10-CM | POA: Diagnosis not present

## 2016-10-10 DIAGNOSIS — G309 Alzheimer's disease, unspecified: Secondary | ICD-10-CM | POA: Diagnosis not present

## 2016-10-11 DIAGNOSIS — G309 Alzheimer's disease, unspecified: Secondary | ICD-10-CM | POA: Diagnosis not present

## 2016-10-12 DIAGNOSIS — G309 Alzheimer's disease, unspecified: Secondary | ICD-10-CM | POA: Diagnosis not present

## 2016-10-13 DIAGNOSIS — R569 Unspecified convulsions: Secondary | ICD-10-CM | POA: Diagnosis not present

## 2016-10-13 DIAGNOSIS — G309 Alzheimer's disease, unspecified: Secondary | ICD-10-CM | POA: Diagnosis not present

## 2016-10-14 DIAGNOSIS — G309 Alzheimer's disease, unspecified: Secondary | ICD-10-CM | POA: Diagnosis not present

## 2016-10-15 DIAGNOSIS — F5101 Primary insomnia: Secondary | ICD-10-CM | POA: Diagnosis not present

## 2016-10-15 DIAGNOSIS — G4089 Other seizures: Secondary | ICD-10-CM | POA: Diagnosis not present

## 2016-10-15 DIAGNOSIS — G309 Alzheimer's disease, unspecified: Secondary | ICD-10-CM | POA: Diagnosis not present

## 2016-10-15 DIAGNOSIS — F329 Major depressive disorder, single episode, unspecified: Secondary | ICD-10-CM | POA: Diagnosis not present

## 2016-10-15 DIAGNOSIS — F0281 Dementia in other diseases classified elsewhere with behavioral disturbance: Secondary | ICD-10-CM | POA: Diagnosis not present

## 2016-10-16 DIAGNOSIS — G309 Alzheimer's disease, unspecified: Secondary | ICD-10-CM | POA: Diagnosis not present

## 2016-10-17 DIAGNOSIS — G309 Alzheimer's disease, unspecified: Secondary | ICD-10-CM | POA: Diagnosis not present

## 2016-10-18 DIAGNOSIS — G309 Alzheimer's disease, unspecified: Secondary | ICD-10-CM | POA: Diagnosis not present

## 2016-10-19 DIAGNOSIS — G309 Alzheimer's disease, unspecified: Secondary | ICD-10-CM | POA: Diagnosis not present

## 2016-10-25 DIAGNOSIS — G309 Alzheimer's disease, unspecified: Secondary | ICD-10-CM | POA: Diagnosis not present

## 2016-10-26 DIAGNOSIS — R569 Unspecified convulsions: Secondary | ICD-10-CM | POA: Diagnosis not present

## 2016-10-26 DIAGNOSIS — G309 Alzheimer's disease, unspecified: Secondary | ICD-10-CM | POA: Diagnosis not present

## 2016-10-27 DIAGNOSIS — G309 Alzheimer's disease, unspecified: Secondary | ICD-10-CM | POA: Diagnosis not present

## 2016-10-28 DIAGNOSIS — G309 Alzheimer's disease, unspecified: Secondary | ICD-10-CM | POA: Diagnosis not present

## 2016-10-29 DIAGNOSIS — G309 Alzheimer's disease, unspecified: Secondary | ICD-10-CM | POA: Diagnosis not present

## 2016-10-30 DIAGNOSIS — G309 Alzheimer's disease, unspecified: Secondary | ICD-10-CM | POA: Diagnosis not present

## 2016-10-31 DIAGNOSIS — G309 Alzheimer's disease, unspecified: Secondary | ICD-10-CM | POA: Diagnosis not present

## 2016-11-01 DIAGNOSIS — G309 Alzheimer's disease, unspecified: Secondary | ICD-10-CM | POA: Diagnosis not present

## 2016-11-02 DIAGNOSIS — G309 Alzheimer's disease, unspecified: Secondary | ICD-10-CM | POA: Diagnosis not present

## 2016-11-03 DIAGNOSIS — E785 Hyperlipidemia, unspecified: Secondary | ICD-10-CM | POA: Diagnosis not present

## 2016-11-03 DIAGNOSIS — H409 Unspecified glaucoma: Secondary | ICD-10-CM | POA: Diagnosis not present

## 2016-11-03 DIAGNOSIS — R569 Unspecified convulsions: Secondary | ICD-10-CM | POA: Diagnosis not present

## 2016-11-03 DIAGNOSIS — G309 Alzheimer's disease, unspecified: Secondary | ICD-10-CM | POA: Diagnosis not present

## 2016-11-03 DIAGNOSIS — I10 Essential (primary) hypertension: Secondary | ICD-10-CM | POA: Diagnosis not present

## 2016-11-03 DIAGNOSIS — R296 Repeated falls: Secondary | ICD-10-CM | POA: Diagnosis not present

## 2016-11-03 DIAGNOSIS — Z950 Presence of cardiac pacemaker: Secondary | ICD-10-CM | POA: Diagnosis not present

## 2016-11-04 DIAGNOSIS — G309 Alzheimer's disease, unspecified: Secondary | ICD-10-CM | POA: Diagnosis not present

## 2016-11-05 DIAGNOSIS — G309 Alzheimer's disease, unspecified: Secondary | ICD-10-CM | POA: Diagnosis not present

## 2016-11-06 DIAGNOSIS — G309 Alzheimer's disease, unspecified: Secondary | ICD-10-CM | POA: Diagnosis not present

## 2016-11-07 DIAGNOSIS — G309 Alzheimer's disease, unspecified: Secondary | ICD-10-CM | POA: Diagnosis not present

## 2016-11-07 DIAGNOSIS — I10 Essential (primary) hypertension: Secondary | ICD-10-CM | POA: Diagnosis not present

## 2016-11-07 DIAGNOSIS — I495 Sick sinus syndrome: Secondary | ICD-10-CM | POA: Diagnosis not present

## 2016-11-07 DIAGNOSIS — I129 Hypertensive chronic kidney disease with stage 1 through stage 4 chronic kidney disease, or unspecified chronic kidney disease: Secondary | ICD-10-CM | POA: Diagnosis not present

## 2016-11-07 DIAGNOSIS — Z79899 Other long term (current) drug therapy: Secondary | ICD-10-CM | POA: Diagnosis not present

## 2016-11-07 DIAGNOSIS — R69 Illness, unspecified: Secondary | ICD-10-CM | POA: Diagnosis not present

## 2016-11-07 DIAGNOSIS — E785 Hyperlipidemia, unspecified: Secondary | ICD-10-CM | POA: Diagnosis not present

## 2016-11-07 DIAGNOSIS — N183 Chronic kidney disease, stage 3 (moderate): Secondary | ICD-10-CM | POA: Diagnosis not present

## 2016-11-07 DIAGNOSIS — R55 Syncope and collapse: Secondary | ICD-10-CM | POA: Diagnosis not present

## 2016-11-07 DIAGNOSIS — G40909 Epilepsy, unspecified, not intractable, without status epilepticus: Secondary | ICD-10-CM | POA: Diagnosis not present

## 2016-11-07 DIAGNOSIS — R402431 Glasgow coma scale score 3-8, in the field [EMT or ambulance]: Secondary | ICD-10-CM | POA: Diagnosis not present

## 2016-11-07 DIAGNOSIS — Z950 Presence of cardiac pacemaker: Secondary | ICD-10-CM | POA: Diagnosis not present

## 2016-11-08 DIAGNOSIS — R69 Illness, unspecified: Secondary | ICD-10-CM | POA: Diagnosis not present

## 2016-11-08 DIAGNOSIS — Z7409 Other reduced mobility: Secondary | ICD-10-CM | POA: Diagnosis not present

## 2016-11-08 DIAGNOSIS — R55 Syncope and collapse: Secondary | ICD-10-CM | POA: Diagnosis not present

## 2016-11-08 DIAGNOSIS — I10 Essential (primary) hypertension: Secondary | ICD-10-CM | POA: Diagnosis not present

## 2016-11-08 DIAGNOSIS — G40909 Epilepsy, unspecified, not intractable, without status epilepticus: Secondary | ICD-10-CM | POA: Diagnosis not present

## 2016-11-08 DIAGNOSIS — G309 Alzheimer's disease, unspecified: Secondary | ICD-10-CM | POA: Diagnosis not present

## 2016-11-08 DIAGNOSIS — R4182 Altered mental status, unspecified: Secondary | ICD-10-CM | POA: Diagnosis not present

## 2016-11-09 DIAGNOSIS — G309 Alzheimer's disease, unspecified: Secondary | ICD-10-CM | POA: Diagnosis not present

## 2016-11-10 DIAGNOSIS — I10 Essential (primary) hypertension: Secondary | ICD-10-CM | POA: Diagnosis not present

## 2016-11-10 DIAGNOSIS — R69 Illness, unspecified: Secondary | ICD-10-CM | POA: Diagnosis not present

## 2016-11-10 DIAGNOSIS — G309 Alzheimer's disease, unspecified: Secondary | ICD-10-CM | POA: Diagnosis not present

## 2016-11-10 DIAGNOSIS — E785 Hyperlipidemia, unspecified: Secondary | ICD-10-CM | POA: Diagnosis not present

## 2016-11-11 DIAGNOSIS — G309 Alzheimer's disease, unspecified: Secondary | ICD-10-CM | POA: Diagnosis not present

## 2016-11-12 DIAGNOSIS — F5101 Primary insomnia: Secondary | ICD-10-CM | POA: Diagnosis not present

## 2016-11-12 DIAGNOSIS — G309 Alzheimer's disease, unspecified: Secondary | ICD-10-CM | POA: Diagnosis not present

## 2016-11-12 DIAGNOSIS — F0281 Dementia in other diseases classified elsewhere with behavioral disturbance: Secondary | ICD-10-CM | POA: Diagnosis not present

## 2016-11-12 DIAGNOSIS — F329 Major depressive disorder, single episode, unspecified: Secondary | ICD-10-CM | POA: Diagnosis not present

## 2016-11-12 DIAGNOSIS — G4089 Other seizures: Secondary | ICD-10-CM | POA: Diagnosis not present

## 2016-11-13 DIAGNOSIS — G309 Alzheimer's disease, unspecified: Secondary | ICD-10-CM | POA: Diagnosis not present

## 2016-11-17 DIAGNOSIS — G309 Alzheimer's disease, unspecified: Secondary | ICD-10-CM | POA: Diagnosis not present

## 2016-11-17 DIAGNOSIS — F329 Major depressive disorder, single episode, unspecified: Secondary | ICD-10-CM | POA: Diagnosis not present

## 2016-11-17 DIAGNOSIS — Z0000 Encounter for general adult medical examination without abnormal findings: Secondary | ICD-10-CM | POA: Diagnosis not present

## 2016-11-18 DIAGNOSIS — G309 Alzheimer's disease, unspecified: Secondary | ICD-10-CM | POA: Diagnosis not present

## 2016-11-19 DIAGNOSIS — G309 Alzheimer's disease, unspecified: Secondary | ICD-10-CM | POA: Diagnosis not present

## 2016-11-20 DIAGNOSIS — G309 Alzheimer's disease, unspecified: Secondary | ICD-10-CM | POA: Diagnosis not present

## 2016-11-21 DIAGNOSIS — G309 Alzheimer's disease, unspecified: Secondary | ICD-10-CM | POA: Diagnosis not present

## 2016-11-24 DIAGNOSIS — G309 Alzheimer's disease, unspecified: Secondary | ICD-10-CM | POA: Diagnosis not present

## 2016-11-25 DIAGNOSIS — G309 Alzheimer's disease, unspecified: Secondary | ICD-10-CM | POA: Diagnosis not present

## 2016-11-26 DIAGNOSIS — G309 Alzheimer's disease, unspecified: Secondary | ICD-10-CM | POA: Diagnosis not present

## 2016-11-27 DIAGNOSIS — G309 Alzheimer's disease, unspecified: Secondary | ICD-10-CM | POA: Diagnosis not present

## 2016-11-28 DIAGNOSIS — G309 Alzheimer's disease, unspecified: Secondary | ICD-10-CM | POA: Diagnosis not present

## 2016-11-28 DIAGNOSIS — G4089 Other seizures: Secondary | ICD-10-CM | POA: Diagnosis not present

## 2016-11-28 DIAGNOSIS — R69 Illness, unspecified: Secondary | ICD-10-CM | POA: Diagnosis not present

## 2016-11-29 DIAGNOSIS — G309 Alzheimer's disease, unspecified: Secondary | ICD-10-CM | POA: Diagnosis not present

## 2016-11-30 DIAGNOSIS — G309 Alzheimer's disease, unspecified: Secondary | ICD-10-CM | POA: Diagnosis not present

## 2016-12-01 DIAGNOSIS — G309 Alzheimer's disease, unspecified: Secondary | ICD-10-CM | POA: Diagnosis not present

## 2016-12-02 DIAGNOSIS — G309 Alzheimer's disease, unspecified: Secondary | ICD-10-CM | POA: Diagnosis not present

## 2016-12-03 DIAGNOSIS — G309 Alzheimer's disease, unspecified: Secondary | ICD-10-CM | POA: Diagnosis not present

## 2016-12-04 DIAGNOSIS — G309 Alzheimer's disease, unspecified: Secondary | ICD-10-CM | POA: Diagnosis not present

## 2016-12-05 DIAGNOSIS — G309 Alzheimer's disease, unspecified: Secondary | ICD-10-CM | POA: Diagnosis not present

## 2016-12-06 DIAGNOSIS — G309 Alzheimer's disease, unspecified: Secondary | ICD-10-CM | POA: Diagnosis not present

## 2016-12-07 DIAGNOSIS — R296 Repeated falls: Secondary | ICD-10-CM | POA: Diagnosis not present

## 2016-12-07 DIAGNOSIS — R569 Unspecified convulsions: Secondary | ICD-10-CM | POA: Diagnosis not present

## 2016-12-07 DIAGNOSIS — H409 Unspecified glaucoma: Secondary | ICD-10-CM | POA: Diagnosis not present

## 2016-12-07 DIAGNOSIS — R4182 Altered mental status, unspecified: Secondary | ICD-10-CM | POA: Diagnosis not present

## 2016-12-07 DIAGNOSIS — I63521 Cerebral infarction due to unspecified occlusion or stenosis of right anterior cerebral artery: Secondary | ICD-10-CM | POA: Diagnosis not present

## 2016-12-07 DIAGNOSIS — I10 Essential (primary) hypertension: Secondary | ICD-10-CM | POA: Diagnosis not present

## 2016-12-07 DIAGNOSIS — R930 Abnormal findings on diagnostic imaging of skull and head, not elsewhere classified: Secondary | ICD-10-CM | POA: Diagnosis not present

## 2016-12-07 DIAGNOSIS — R69 Illness, unspecified: Secondary | ICD-10-CM | POA: Diagnosis not present

## 2016-12-07 DIAGNOSIS — Z950 Presence of cardiac pacemaker: Secondary | ICD-10-CM | POA: Diagnosis not present

## 2016-12-07 DIAGNOSIS — Z7982 Long term (current) use of aspirin: Secondary | ICD-10-CM | POA: Diagnosis not present

## 2016-12-07 DIAGNOSIS — R402431 Glasgow coma scale score 3-8, in the field [EMT or ambulance]: Secondary | ICD-10-CM | POA: Diagnosis not present

## 2016-12-07 DIAGNOSIS — E785 Hyperlipidemia, unspecified: Secondary | ICD-10-CM | POA: Diagnosis not present

## 2016-12-07 DIAGNOSIS — Z79899 Other long term (current) drug therapy: Secondary | ICD-10-CM | POA: Diagnosis not present

## 2016-12-07 DIAGNOSIS — G309 Alzheimer's disease, unspecified: Secondary | ICD-10-CM | POA: Diagnosis not present

## 2016-12-08 DIAGNOSIS — H409 Unspecified glaucoma: Secondary | ICD-10-CM | POA: Diagnosis not present

## 2016-12-08 DIAGNOSIS — E785 Hyperlipidemia, unspecified: Secondary | ICD-10-CM | POA: Diagnosis not present

## 2016-12-08 DIAGNOSIS — G309 Alzheimer's disease, unspecified: Secondary | ICD-10-CM | POA: Diagnosis not present

## 2016-12-08 DIAGNOSIS — Z79899 Other long term (current) drug therapy: Secondary | ICD-10-CM | POA: Diagnosis not present

## 2016-12-08 DIAGNOSIS — Z7982 Long term (current) use of aspirin: Secondary | ICD-10-CM | POA: Diagnosis not present

## 2016-12-08 DIAGNOSIS — I10 Essential (primary) hypertension: Secondary | ICD-10-CM | POA: Diagnosis not present

## 2016-12-08 DIAGNOSIS — R69 Illness, unspecified: Secondary | ICD-10-CM | POA: Diagnosis not present

## 2016-12-08 DIAGNOSIS — Z7409 Other reduced mobility: Secondary | ICD-10-CM | POA: Diagnosis not present

## 2016-12-08 DIAGNOSIS — Z950 Presence of cardiac pacemaker: Secondary | ICD-10-CM | POA: Diagnosis not present

## 2016-12-08 DIAGNOSIS — R4182 Altered mental status, unspecified: Secondary | ICD-10-CM | POA: Diagnosis not present

## 2016-12-10 DIAGNOSIS — G309 Alzheimer's disease, unspecified: Secondary | ICD-10-CM | POA: Diagnosis not present

## 2016-12-11 DIAGNOSIS — F329 Major depressive disorder, single episode, unspecified: Secondary | ICD-10-CM | POA: Diagnosis not present

## 2016-12-11 DIAGNOSIS — G309 Alzheimer's disease, unspecified: Secondary | ICD-10-CM | POA: Diagnosis not present

## 2016-12-11 DIAGNOSIS — F5101 Primary insomnia: Secondary | ICD-10-CM | POA: Diagnosis not present

## 2016-12-11 DIAGNOSIS — G4089 Other seizures: Secondary | ICD-10-CM | POA: Diagnosis not present

## 2016-12-11 DIAGNOSIS — F0281 Dementia in other diseases classified elsewhere with behavioral disturbance: Secondary | ICD-10-CM | POA: Diagnosis not present

## 2016-12-12 DIAGNOSIS — G309 Alzheimer's disease, unspecified: Secondary | ICD-10-CM | POA: Diagnosis not present

## 2016-12-13 DIAGNOSIS — G309 Alzheimer's disease, unspecified: Secondary | ICD-10-CM | POA: Diagnosis not present

## 2016-12-14 DIAGNOSIS — G309 Alzheimer's disease, unspecified: Secondary | ICD-10-CM | POA: Diagnosis not present

## 2016-12-15 DIAGNOSIS — G309 Alzheimer's disease, unspecified: Secondary | ICD-10-CM | POA: Diagnosis not present

## 2016-12-15 DIAGNOSIS — R569 Unspecified convulsions: Secondary | ICD-10-CM | POA: Diagnosis not present

## 2016-12-15 DIAGNOSIS — E785 Hyperlipidemia, unspecified: Secondary | ICD-10-CM | POA: Diagnosis not present

## 2016-12-15 DIAGNOSIS — H409 Unspecified glaucoma: Secondary | ICD-10-CM | POA: Diagnosis not present

## 2016-12-15 DIAGNOSIS — R296 Repeated falls: Secondary | ICD-10-CM | POA: Diagnosis not present

## 2016-12-15 DIAGNOSIS — I10 Essential (primary) hypertension: Secondary | ICD-10-CM | POA: Diagnosis not present

## 2016-12-15 DIAGNOSIS — Z950 Presence of cardiac pacemaker: Secondary | ICD-10-CM | POA: Diagnosis not present

## 2016-12-16 DIAGNOSIS — G309 Alzheimer's disease, unspecified: Secondary | ICD-10-CM | POA: Diagnosis not present

## 2016-12-17 DIAGNOSIS — G309 Alzheimer's disease, unspecified: Secondary | ICD-10-CM | POA: Diagnosis not present

## 2016-12-18 DIAGNOSIS — G309 Alzheimer's disease, unspecified: Secondary | ICD-10-CM | POA: Diagnosis not present

## 2016-12-19 DIAGNOSIS — G309 Alzheimer's disease, unspecified: Secondary | ICD-10-CM | POA: Diagnosis not present

## 2016-12-20 DIAGNOSIS — G309 Alzheimer's disease, unspecified: Secondary | ICD-10-CM | POA: Diagnosis not present

## 2016-12-21 DIAGNOSIS — G309 Alzheimer's disease, unspecified: Secondary | ICD-10-CM | POA: Diagnosis not present

## 2016-12-22 DIAGNOSIS — G309 Alzheimer's disease, unspecified: Secondary | ICD-10-CM | POA: Diagnosis not present

## 2016-12-23 DIAGNOSIS — G309 Alzheimer's disease, unspecified: Secondary | ICD-10-CM | POA: Diagnosis not present

## 2016-12-24 DIAGNOSIS — G309 Alzheimer's disease, unspecified: Secondary | ICD-10-CM | POA: Diagnosis not present

## 2016-12-25 DIAGNOSIS — G309 Alzheimer's disease, unspecified: Secondary | ICD-10-CM | POA: Diagnosis not present

## 2016-12-26 DIAGNOSIS — G309 Alzheimer's disease, unspecified: Secondary | ICD-10-CM | POA: Diagnosis not present

## 2016-12-27 DIAGNOSIS — G309 Alzheimer's disease, unspecified: Secondary | ICD-10-CM | POA: Diagnosis not present

## 2016-12-28 DIAGNOSIS — G309 Alzheimer's disease, unspecified: Secondary | ICD-10-CM | POA: Diagnosis not present

## 2016-12-29 DIAGNOSIS — G309 Alzheimer's disease, unspecified: Secondary | ICD-10-CM | POA: Diagnosis not present

## 2016-12-30 DIAGNOSIS — G309 Alzheimer's disease, unspecified: Secondary | ICD-10-CM | POA: Diagnosis not present

## 2016-12-31 DIAGNOSIS — G309 Alzheimer's disease, unspecified: Secondary | ICD-10-CM | POA: Diagnosis not present

## 2017-01-01 DIAGNOSIS — G309 Alzheimer's disease, unspecified: Secondary | ICD-10-CM | POA: Diagnosis not present

## 2017-01-02 DIAGNOSIS — G309 Alzheimer's disease, unspecified: Secondary | ICD-10-CM | POA: Diagnosis not present

## 2017-01-03 DIAGNOSIS — G309 Alzheimer's disease, unspecified: Secondary | ICD-10-CM | POA: Diagnosis not present

## 2017-01-04 DIAGNOSIS — G309 Alzheimer's disease, unspecified: Secondary | ICD-10-CM | POA: Diagnosis not present

## 2017-01-05 DIAGNOSIS — G309 Alzheimer's disease, unspecified: Secondary | ICD-10-CM | POA: Diagnosis not present

## 2017-01-06 DIAGNOSIS — G309 Alzheimer's disease, unspecified: Secondary | ICD-10-CM | POA: Diagnosis not present

## 2017-01-07 DIAGNOSIS — G309 Alzheimer's disease, unspecified: Secondary | ICD-10-CM | POA: Diagnosis not present

## 2017-01-08 DIAGNOSIS — G309 Alzheimer's disease, unspecified: Secondary | ICD-10-CM | POA: Diagnosis not present

## 2017-01-09 DIAGNOSIS — G309 Alzheimer's disease, unspecified: Secondary | ICD-10-CM | POA: Diagnosis not present

## 2017-01-10 DIAGNOSIS — G309 Alzheimer's disease, unspecified: Secondary | ICD-10-CM | POA: Diagnosis not present

## 2017-01-11 DIAGNOSIS — G309 Alzheimer's disease, unspecified: Secondary | ICD-10-CM | POA: Diagnosis not present

## 2017-01-12 DIAGNOSIS — G309 Alzheimer's disease, unspecified: Secondary | ICD-10-CM | POA: Diagnosis not present

## 2017-01-13 DIAGNOSIS — G309 Alzheimer's disease, unspecified: Secondary | ICD-10-CM | POA: Diagnosis not present

## 2017-01-14 DIAGNOSIS — G309 Alzheimer's disease, unspecified: Secondary | ICD-10-CM | POA: Diagnosis not present

## 2017-01-15 DIAGNOSIS — G309 Alzheimer's disease, unspecified: Secondary | ICD-10-CM | POA: Diagnosis not present

## 2017-01-16 DIAGNOSIS — G309 Alzheimer's disease, unspecified: Secondary | ICD-10-CM | POA: Diagnosis not present

## 2017-01-17 DIAGNOSIS — G309 Alzheimer's disease, unspecified: Secondary | ICD-10-CM | POA: Diagnosis not present

## 2017-01-18 DIAGNOSIS — G309 Alzheimer's disease, unspecified: Secondary | ICD-10-CM | POA: Diagnosis not present

## 2017-01-19 DIAGNOSIS — H409 Unspecified glaucoma: Secondary | ICD-10-CM | POA: Diagnosis not present

## 2017-01-19 DIAGNOSIS — R296 Repeated falls: Secondary | ICD-10-CM | POA: Diagnosis not present

## 2017-01-19 DIAGNOSIS — R569 Unspecified convulsions: Secondary | ICD-10-CM | POA: Diagnosis not present

## 2017-01-19 DIAGNOSIS — R05 Cough: Secondary | ICD-10-CM | POA: Diagnosis not present

## 2017-01-19 DIAGNOSIS — R110 Nausea: Secondary | ICD-10-CM | POA: Diagnosis not present

## 2017-01-19 DIAGNOSIS — G309 Alzheimer's disease, unspecified: Secondary | ICD-10-CM | POA: Diagnosis not present

## 2017-01-19 DIAGNOSIS — E785 Hyperlipidemia, unspecified: Secondary | ICD-10-CM | POA: Diagnosis not present

## 2017-01-19 DIAGNOSIS — Z950 Presence of cardiac pacemaker: Secondary | ICD-10-CM | POA: Diagnosis not present

## 2017-01-19 DIAGNOSIS — I10 Essential (primary) hypertension: Secondary | ICD-10-CM | POA: Diagnosis not present

## 2017-01-20 DIAGNOSIS — G309 Alzheimer's disease, unspecified: Secondary | ICD-10-CM | POA: Diagnosis not present

## 2017-01-21 DIAGNOSIS — G309 Alzheimer's disease, unspecified: Secondary | ICD-10-CM | POA: Diagnosis not present

## 2017-01-22 DIAGNOSIS — G309 Alzheimer's disease, unspecified: Secondary | ICD-10-CM | POA: Diagnosis not present

## 2017-01-23 DIAGNOSIS — G309 Alzheimer's disease, unspecified: Secondary | ICD-10-CM | POA: Diagnosis not present

## 2017-01-26 DIAGNOSIS — G309 Alzheimer's disease, unspecified: Secondary | ICD-10-CM | POA: Diagnosis not present

## 2017-01-26 DIAGNOSIS — I10 Essential (primary) hypertension: Secondary | ICD-10-CM | POA: Diagnosis not present

## 2017-01-26 DIAGNOSIS — N390 Urinary tract infection, site not specified: Secondary | ICD-10-CM | POA: Diagnosis not present

## 2017-01-27 DIAGNOSIS — R05 Cough: Secondary | ICD-10-CM | POA: Diagnosis not present

## 2017-01-27 DIAGNOSIS — G309 Alzheimer's disease, unspecified: Secondary | ICD-10-CM | POA: Diagnosis not present

## 2017-01-28 DIAGNOSIS — G309 Alzheimer's disease, unspecified: Secondary | ICD-10-CM | POA: Diagnosis not present

## 2017-01-29 DIAGNOSIS — G309 Alzheimer's disease, unspecified: Secondary | ICD-10-CM | POA: Diagnosis not present

## 2017-01-30 DIAGNOSIS — G309 Alzheimer's disease, unspecified: Secondary | ICD-10-CM | POA: Diagnosis not present

## 2017-01-31 DIAGNOSIS — G309 Alzheimer's disease, unspecified: Secondary | ICD-10-CM | POA: Diagnosis not present

## 2017-02-01 DIAGNOSIS — G309 Alzheimer's disease, unspecified: Secondary | ICD-10-CM | POA: Diagnosis not present

## 2017-02-04 DIAGNOSIS — I672 Cerebral atherosclerosis: Secondary | ICD-10-CM | POA: Diagnosis not present

## 2017-02-04 DIAGNOSIS — Z79899 Other long term (current) drug therapy: Secondary | ICD-10-CM | POA: Diagnosis not present

## 2017-02-04 DIAGNOSIS — R509 Fever, unspecified: Secondary | ICD-10-CM | POA: Diagnosis not present

## 2017-02-04 DIAGNOSIS — R402431 Glasgow coma scale score 3-8, in the field [EMT or ambulance]: Secondary | ICD-10-CM | POA: Diagnosis not present

## 2017-02-04 DIAGNOSIS — I63521 Cerebral infarction due to unspecified occlusion or stenosis of right anterior cerebral artery: Secondary | ICD-10-CM | POA: Diagnosis not present

## 2017-02-04 DIAGNOSIS — E785 Hyperlipidemia, unspecified: Secondary | ICD-10-CM | POA: Diagnosis not present

## 2017-02-04 DIAGNOSIS — R062 Wheezing: Secondary | ICD-10-CM | POA: Diagnosis not present

## 2017-02-04 DIAGNOSIS — Z950 Presence of cardiac pacemaker: Secondary | ICD-10-CM | POA: Diagnosis not present

## 2017-02-04 DIAGNOSIS — R0602 Shortness of breath: Secondary | ICD-10-CM | POA: Diagnosis not present

## 2017-02-04 DIAGNOSIS — Z7982 Long term (current) use of aspirin: Secondary | ICD-10-CM | POA: Diagnosis not present

## 2017-02-04 DIAGNOSIS — Z7409 Other reduced mobility: Secondary | ICD-10-CM | POA: Diagnosis not present

## 2017-02-04 DIAGNOSIS — I10 Essential (primary) hypertension: Secondary | ICD-10-CM | POA: Diagnosis not present

## 2017-02-04 DIAGNOSIS — G309 Alzheimer's disease, unspecified: Secondary | ICD-10-CM | POA: Diagnosis not present

## 2017-02-04 DIAGNOSIS — E860 Dehydration: Secondary | ICD-10-CM | POA: Diagnosis not present

## 2017-02-04 DIAGNOSIS — F329 Major depressive disorder, single episode, unspecified: Secondary | ICD-10-CM | POA: Diagnosis not present

## 2017-02-04 DIAGNOSIS — R4182 Altered mental status, unspecified: Secondary | ICD-10-CM | POA: Diagnosis not present

## 2017-02-04 DIAGNOSIS — R69 Illness, unspecified: Secondary | ICD-10-CM | POA: Diagnosis not present

## 2017-02-07 DIAGNOSIS — G309 Alzheimer's disease, unspecified: Secondary | ICD-10-CM | POA: Diagnosis not present

## 2017-02-08 DIAGNOSIS — G309 Alzheimer's disease, unspecified: Secondary | ICD-10-CM | POA: Diagnosis not present

## 2017-02-09 DIAGNOSIS — G309 Alzheimer's disease, unspecified: Secondary | ICD-10-CM | POA: Diagnosis not present

## 2017-02-09 DIAGNOSIS — Z950 Presence of cardiac pacemaker: Secondary | ICD-10-CM | POA: Diagnosis not present

## 2017-02-09 DIAGNOSIS — R05 Cough: Secondary | ICD-10-CM | POA: Diagnosis not present

## 2017-02-09 DIAGNOSIS — R110 Nausea: Secondary | ICD-10-CM | POA: Diagnosis not present

## 2017-02-09 DIAGNOSIS — R296 Repeated falls: Secondary | ICD-10-CM | POA: Diagnosis not present

## 2017-02-09 DIAGNOSIS — H409 Unspecified glaucoma: Secondary | ICD-10-CM | POA: Diagnosis not present

## 2017-02-09 DIAGNOSIS — R569 Unspecified convulsions: Secondary | ICD-10-CM | POA: Diagnosis not present

## 2017-02-09 DIAGNOSIS — I10 Essential (primary) hypertension: Secondary | ICD-10-CM | POA: Diagnosis not present

## 2017-02-09 DIAGNOSIS — E785 Hyperlipidemia, unspecified: Secondary | ICD-10-CM | POA: Diagnosis not present

## 2017-02-10 DIAGNOSIS — G309 Alzheimer's disease, unspecified: Secondary | ICD-10-CM | POA: Diagnosis not present

## 2017-02-11 DIAGNOSIS — R69 Illness, unspecified: Secondary | ICD-10-CM | POA: Diagnosis not present

## 2017-02-11 DIAGNOSIS — G4089 Other seizures: Secondary | ICD-10-CM | POA: Diagnosis not present

## 2017-02-11 DIAGNOSIS — G309 Alzheimer's disease, unspecified: Secondary | ICD-10-CM | POA: Diagnosis not present

## 2017-02-12 DIAGNOSIS — G309 Alzheimer's disease, unspecified: Secondary | ICD-10-CM | POA: Diagnosis not present

## 2017-02-13 DIAGNOSIS — G309 Alzheimer's disease, unspecified: Secondary | ICD-10-CM | POA: Diagnosis not present

## 2017-02-14 DIAGNOSIS — G309 Alzheimer's disease, unspecified: Secondary | ICD-10-CM | POA: Diagnosis not present

## 2017-02-15 DIAGNOSIS — G309 Alzheimer's disease, unspecified: Secondary | ICD-10-CM | POA: Diagnosis not present

## 2017-02-16 DIAGNOSIS — G309 Alzheimer's disease, unspecified: Secondary | ICD-10-CM | POA: Diagnosis not present

## 2017-02-17 DIAGNOSIS — G309 Alzheimer's disease, unspecified: Secondary | ICD-10-CM | POA: Diagnosis not present

## 2017-02-18 DIAGNOSIS — G309 Alzheimer's disease, unspecified: Secondary | ICD-10-CM | POA: Diagnosis not present

## 2017-02-19 DIAGNOSIS — G309 Alzheimer's disease, unspecified: Secondary | ICD-10-CM | POA: Diagnosis not present

## 2017-02-20 DIAGNOSIS — G309 Alzheimer's disease, unspecified: Secondary | ICD-10-CM | POA: Diagnosis not present

## 2017-02-21 DIAGNOSIS — G309 Alzheimer's disease, unspecified: Secondary | ICD-10-CM | POA: Diagnosis not present

## 2017-02-22 DIAGNOSIS — G309 Alzheimer's disease, unspecified: Secondary | ICD-10-CM | POA: Diagnosis not present

## 2017-02-23 DIAGNOSIS — G309 Alzheimer's disease, unspecified: Secondary | ICD-10-CM | POA: Diagnosis not present

## 2017-02-24 DIAGNOSIS — G309 Alzheimer's disease, unspecified: Secondary | ICD-10-CM | POA: Diagnosis not present

## 2017-02-25 DIAGNOSIS — G309 Alzheimer's disease, unspecified: Secondary | ICD-10-CM | POA: Diagnosis not present

## 2017-02-25 DIAGNOSIS — R69 Illness, unspecified: Secondary | ICD-10-CM | POA: Diagnosis not present

## 2017-02-25 DIAGNOSIS — G4089 Other seizures: Secondary | ICD-10-CM | POA: Diagnosis not present

## 2017-02-26 DIAGNOSIS — G309 Alzheimer's disease, unspecified: Secondary | ICD-10-CM | POA: Diagnosis not present

## 2017-02-27 DIAGNOSIS — G309 Alzheimer's disease, unspecified: Secondary | ICD-10-CM | POA: Diagnosis not present

## 2017-02-28 DIAGNOSIS — G309 Alzheimer's disease, unspecified: Secondary | ICD-10-CM | POA: Diagnosis not present

## 2017-03-01 DIAGNOSIS — G309 Alzheimer's disease, unspecified: Secondary | ICD-10-CM | POA: Diagnosis not present

## 2017-03-02 DIAGNOSIS — G309 Alzheimer's disease, unspecified: Secondary | ICD-10-CM | POA: Diagnosis not present

## 2017-03-03 DIAGNOSIS — G309 Alzheimer's disease, unspecified: Secondary | ICD-10-CM | POA: Diagnosis not present

## 2017-03-04 DIAGNOSIS — G309 Alzheimer's disease, unspecified: Secondary | ICD-10-CM | POA: Diagnosis not present

## 2017-03-05 DIAGNOSIS — G309 Alzheimer's disease, unspecified: Secondary | ICD-10-CM | POA: Diagnosis not present

## 2017-03-06 DIAGNOSIS — G309 Alzheimer's disease, unspecified: Secondary | ICD-10-CM | POA: Diagnosis not present

## 2017-03-07 DIAGNOSIS — G309 Alzheimer's disease, unspecified: Secondary | ICD-10-CM | POA: Diagnosis not present

## 2017-03-08 DIAGNOSIS — G309 Alzheimer's disease, unspecified: Secondary | ICD-10-CM | POA: Diagnosis not present

## 2017-03-13 DIAGNOSIS — M47897 Other spondylosis, lumbosacral region: Secondary | ICD-10-CM | POA: Diagnosis not present

## 2017-03-13 DIAGNOSIS — I10 Essential (primary) hypertension: Secondary | ICD-10-CM | POA: Diagnosis not present

## 2017-03-13 DIAGNOSIS — M47896 Other spondylosis, lumbar region: Secondary | ICD-10-CM | POA: Diagnosis not present

## 2017-03-13 DIAGNOSIS — M48061 Spinal stenosis, lumbar region without neurogenic claudication: Secondary | ICD-10-CM | POA: Diagnosis not present

## 2017-03-13 DIAGNOSIS — Z79899 Other long term (current) drug therapy: Secondary | ICD-10-CM | POA: Diagnosis not present

## 2017-03-13 DIAGNOSIS — M5136 Other intervertebral disc degeneration, lumbar region: Secondary | ICD-10-CM | POA: Diagnosis not present

## 2017-03-13 DIAGNOSIS — E785 Hyperlipidemia, unspecified: Secondary | ICD-10-CM | POA: Diagnosis not present

## 2017-03-13 DIAGNOSIS — Z950 Presence of cardiac pacemaker: Secondary | ICD-10-CM | POA: Diagnosis not present

## 2017-03-13 DIAGNOSIS — M5489 Other dorsalgia: Secondary | ICD-10-CM | POA: Diagnosis not present

## 2017-03-13 DIAGNOSIS — M545 Low back pain: Secondary | ICD-10-CM | POA: Diagnosis not present

## 2017-03-13 DIAGNOSIS — G309 Alzheimer's disease, unspecified: Secondary | ICD-10-CM | POA: Diagnosis not present

## 2017-03-13 DIAGNOSIS — S33140A Subluxation of L4/L5 lumbar vertebra, initial encounter: Secondary | ICD-10-CM | POA: Diagnosis not present

## 2017-03-13 DIAGNOSIS — R69 Illness, unspecified: Secondary | ICD-10-CM | POA: Diagnosis not present

## 2017-03-13 DIAGNOSIS — Z7982 Long term (current) use of aspirin: Secondary | ICD-10-CM | POA: Diagnosis not present

## 2017-03-14 DIAGNOSIS — M545 Low back pain: Secondary | ICD-10-CM | POA: Diagnosis not present

## 2017-03-14 DIAGNOSIS — R4182 Altered mental status, unspecified: Secondary | ICD-10-CM | POA: Diagnosis not present

## 2017-03-22 DIAGNOSIS — M545 Low back pain: Secondary | ICD-10-CM | POA: Diagnosis not present

## 2017-03-22 DIAGNOSIS — R569 Unspecified convulsions: Secondary | ICD-10-CM | POA: Diagnosis not present

## 2017-03-22 DIAGNOSIS — E785 Hyperlipidemia, unspecified: Secondary | ICD-10-CM | POA: Diagnosis not present

## 2017-03-25 DIAGNOSIS — G309 Alzheimer's disease, unspecified: Secondary | ICD-10-CM | POA: Diagnosis not present

## 2017-03-25 DIAGNOSIS — R69 Illness, unspecified: Secondary | ICD-10-CM | POA: Diagnosis not present

## 2017-03-25 DIAGNOSIS — G4089 Other seizures: Secondary | ICD-10-CM | POA: Diagnosis not present

## 2017-04-07 DIAGNOSIS — G4089 Other seizures: Secondary | ICD-10-CM | POA: Diagnosis not present

## 2017-04-07 DIAGNOSIS — R110 Nausea: Secondary | ICD-10-CM | POA: Diagnosis not present

## 2017-04-07 DIAGNOSIS — R569 Unspecified convulsions: Secondary | ICD-10-CM | POA: Diagnosis not present

## 2017-04-07 DIAGNOSIS — E785 Hyperlipidemia, unspecified: Secondary | ICD-10-CM | POA: Diagnosis not present

## 2017-04-07 DIAGNOSIS — I10 Essential (primary) hypertension: Secondary | ICD-10-CM | POA: Diagnosis not present

## 2017-04-07 DIAGNOSIS — M545 Low back pain: Secondary | ICD-10-CM | POA: Diagnosis not present

## 2017-04-07 DIAGNOSIS — R296 Repeated falls: Secondary | ICD-10-CM | POA: Diagnosis not present

## 2017-04-07 DIAGNOSIS — R05 Cough: Secondary | ICD-10-CM | POA: Diagnosis not present

## 2017-04-07 DIAGNOSIS — H409 Unspecified glaucoma: Secondary | ICD-10-CM | POA: Diagnosis not present

## 2017-04-07 DIAGNOSIS — G309 Alzheimer's disease, unspecified: Secondary | ICD-10-CM | POA: Diagnosis not present

## 2017-04-13 DIAGNOSIS — I10 Essential (primary) hypertension: Secondary | ICD-10-CM | POA: Diagnosis not present

## 2017-04-13 DIAGNOSIS — R569 Unspecified convulsions: Secondary | ICD-10-CM | POA: Diagnosis not present

## 2017-04-22 DIAGNOSIS — R69 Illness, unspecified: Secondary | ICD-10-CM | POA: Diagnosis not present

## 2017-04-22 DIAGNOSIS — G309 Alzheimer's disease, unspecified: Secondary | ICD-10-CM | POA: Diagnosis not present

## 2017-04-22 DIAGNOSIS — G4089 Other seizures: Secondary | ICD-10-CM | POA: Diagnosis not present

## 2017-04-27 DIAGNOSIS — R569 Unspecified convulsions: Secondary | ICD-10-CM | POA: Diagnosis not present

## 2017-04-27 DIAGNOSIS — E785 Hyperlipidemia, unspecified: Secondary | ICD-10-CM | POA: Diagnosis not present

## 2017-04-27 DIAGNOSIS — I10 Essential (primary) hypertension: Secondary | ICD-10-CM | POA: Diagnosis not present

## 2017-04-30 DIAGNOSIS — M545 Low back pain: Secondary | ICD-10-CM | POA: Diagnosis not present

## 2017-04-30 DIAGNOSIS — E785 Hyperlipidemia, unspecified: Secondary | ICD-10-CM | POA: Diagnosis not present

## 2017-04-30 DIAGNOSIS — R110 Nausea: Secondary | ICD-10-CM | POA: Diagnosis not present

## 2017-05-03 DIAGNOSIS — R69 Illness, unspecified: Secondary | ICD-10-CM | POA: Diagnosis not present

## 2017-05-03 DIAGNOSIS — G309 Alzheimer's disease, unspecified: Secondary | ICD-10-CM | POA: Diagnosis not present

## 2017-05-03 DIAGNOSIS — R4182 Altered mental status, unspecified: Secondary | ICD-10-CM | POA: Diagnosis not present

## 2017-05-03 DIAGNOSIS — Z950 Presence of cardiac pacemaker: Secondary | ICD-10-CM | POA: Diagnosis not present

## 2017-05-03 DIAGNOSIS — E785 Hyperlipidemia, unspecified: Secondary | ICD-10-CM | POA: Diagnosis not present

## 2017-05-03 DIAGNOSIS — I10 Essential (primary) hypertension: Secondary | ICD-10-CM | POA: Diagnosis not present

## 2017-05-03 DIAGNOSIS — Z79899 Other long term (current) drug therapy: Secondary | ICD-10-CM | POA: Diagnosis not present

## 2017-05-03 DIAGNOSIS — F329 Major depressive disorder, single episode, unspecified: Secondary | ICD-10-CM | POA: Diagnosis not present

## 2017-05-03 DIAGNOSIS — Z008 Encounter for other general examination: Secondary | ICD-10-CM | POA: Diagnosis not present

## 2017-05-03 DIAGNOSIS — F0281 Dementia in other diseases classified elsewhere with behavioral disturbance: Secondary | ICD-10-CM | POA: Diagnosis not present

## 2017-05-04 DIAGNOSIS — R32 Unspecified urinary incontinence: Secondary | ICD-10-CM | POA: Diagnosis not present

## 2017-05-06 DIAGNOSIS — R69 Illness, unspecified: Secondary | ICD-10-CM | POA: Diagnosis not present

## 2017-05-06 DIAGNOSIS — G4089 Other seizures: Secondary | ICD-10-CM | POA: Diagnosis not present

## 2017-05-06 DIAGNOSIS — G309 Alzheimer's disease, unspecified: Secondary | ICD-10-CM | POA: Diagnosis not present

## 2017-05-08 DIAGNOSIS — Z8673 Personal history of transient ischemic attack (TIA), and cerebral infarction without residual deficits: Secondary | ICD-10-CM | POA: Diagnosis not present

## 2017-05-08 DIAGNOSIS — Z79899 Other long term (current) drug therapy: Secondary | ICD-10-CM | POA: Diagnosis not present

## 2017-05-08 DIAGNOSIS — G40909 Epilepsy, unspecified, not intractable, without status epilepticus: Secondary | ICD-10-CM | POA: Diagnosis not present

## 2017-05-08 DIAGNOSIS — G309 Alzheimer's disease, unspecified: Secondary | ICD-10-CM | POA: Diagnosis not present

## 2017-05-08 DIAGNOSIS — E785 Hyperlipidemia, unspecified: Secondary | ICD-10-CM | POA: Diagnosis not present

## 2017-05-08 DIAGNOSIS — G9340 Encephalopathy, unspecified: Secondary | ICD-10-CM | POA: Diagnosis not present

## 2017-05-08 DIAGNOSIS — Z950 Presence of cardiac pacemaker: Secondary | ICD-10-CM | POA: Diagnosis not present

## 2017-05-08 DIAGNOSIS — R918 Other nonspecific abnormal finding of lung field: Secondary | ICD-10-CM | POA: Diagnosis not present

## 2017-05-08 DIAGNOSIS — R930 Abnormal findings on diagnostic imaging of skull and head, not elsewhere classified: Secondary | ICD-10-CM | POA: Diagnosis not present

## 2017-05-08 DIAGNOSIS — R6889 Other general symptoms and signs: Secondary | ICD-10-CM | POA: Diagnosis not present

## 2017-05-08 DIAGNOSIS — R404 Transient alteration of awareness: Secondary | ICD-10-CM | POA: Diagnosis not present

## 2017-05-08 DIAGNOSIS — I10 Essential (primary) hypertension: Secondary | ICD-10-CM | POA: Diagnosis not present

## 2017-05-08 DIAGNOSIS — R69 Illness, unspecified: Secondary | ICD-10-CM | POA: Diagnosis not present

## 2017-05-08 DIAGNOSIS — R4182 Altered mental status, unspecified: Secondary | ICD-10-CM | POA: Diagnosis not present

## 2017-05-09 DIAGNOSIS — G9340 Encephalopathy, unspecified: Secondary | ICD-10-CM | POA: Diagnosis not present

## 2017-05-10 DIAGNOSIS — G40909 Epilepsy, unspecified, not intractable, without status epilepticus: Secondary | ICD-10-CM | POA: Diagnosis not present

## 2017-05-10 DIAGNOSIS — I10 Essential (primary) hypertension: Secondary | ICD-10-CM | POA: Diagnosis not present

## 2017-05-10 DIAGNOSIS — Z7409 Other reduced mobility: Secondary | ICD-10-CM | POA: Diagnosis not present

## 2017-05-10 DIAGNOSIS — G309 Alzheimer's disease, unspecified: Secondary | ICD-10-CM | POA: Diagnosis not present

## 2017-05-10 DIAGNOSIS — R69 Illness, unspecified: Secondary | ICD-10-CM | POA: Diagnosis not present

## 2017-05-10 DIAGNOSIS — I495 Sick sinus syndrome: Secondary | ICD-10-CM | POA: Diagnosis not present

## 2017-05-10 DIAGNOSIS — N179 Acute kidney failure, unspecified: Secondary | ICD-10-CM | POA: Diagnosis not present

## 2017-05-10 DIAGNOSIS — D696 Thrombocytopenia, unspecified: Secondary | ICD-10-CM | POA: Diagnosis not present

## 2017-05-10 DIAGNOSIS — E785 Hyperlipidemia, unspecified: Secondary | ICD-10-CM | POA: Diagnosis not present

## 2017-05-10 DIAGNOSIS — G9340 Encephalopathy, unspecified: Secondary | ICD-10-CM | POA: Diagnosis not present

## 2017-05-10 DIAGNOSIS — H409 Unspecified glaucoma: Secondary | ICD-10-CM | POA: Diagnosis not present

## 2017-05-18 DIAGNOSIS — G9340 Encephalopathy, unspecified: Secondary | ICD-10-CM | POA: Diagnosis not present

## 2017-05-18 DIAGNOSIS — D696 Thrombocytopenia, unspecified: Secondary | ICD-10-CM | POA: Diagnosis not present

## 2017-05-18 DIAGNOSIS — R569 Unspecified convulsions: Secondary | ICD-10-CM | POA: Diagnosis not present

## 2017-05-18 DIAGNOSIS — E039 Hypothyroidism, unspecified: Secondary | ICD-10-CM | POA: Diagnosis not present

## 2017-05-18 DIAGNOSIS — N179 Acute kidney failure, unspecified: Secondary | ICD-10-CM | POA: Diagnosis not present

## 2017-05-18 DIAGNOSIS — I495 Sick sinus syndrome: Secondary | ICD-10-CM | POA: Diagnosis not present

## 2017-05-23 DIAGNOSIS — I4891 Unspecified atrial fibrillation: Secondary | ICD-10-CM | POA: Diagnosis not present

## 2017-05-23 DIAGNOSIS — Z950 Presence of cardiac pacemaker: Secondary | ICD-10-CM | POA: Diagnosis not present

## 2017-05-23 DIAGNOSIS — G40909 Epilepsy, unspecified, not intractable, without status epilepticus: Secondary | ICD-10-CM | POA: Diagnosis not present

## 2017-05-23 DIAGNOSIS — R4182 Altered mental status, unspecified: Secondary | ICD-10-CM | POA: Diagnosis not present

## 2017-05-23 DIAGNOSIS — E785 Hyperlipidemia, unspecified: Secondary | ICD-10-CM | POA: Diagnosis not present

## 2017-05-23 DIAGNOSIS — Z79899 Other long term (current) drug therapy: Secondary | ICD-10-CM | POA: Diagnosis not present

## 2017-05-23 DIAGNOSIS — N189 Chronic kidney disease, unspecified: Secondary | ICD-10-CM | POA: Diagnosis not present

## 2017-05-23 DIAGNOSIS — R531 Weakness: Secondary | ICD-10-CM | POA: Diagnosis not present

## 2017-05-23 DIAGNOSIS — I129 Hypertensive chronic kidney disease with stage 1 through stage 4 chronic kidney disease, or unspecified chronic kidney disease: Secondary | ICD-10-CM | POA: Diagnosis not present

## 2017-05-23 DIAGNOSIS — R69 Illness, unspecified: Secondary | ICD-10-CM | POA: Diagnosis not present

## 2017-05-23 DIAGNOSIS — G309 Alzheimer's disease, unspecified: Secondary | ICD-10-CM | POA: Diagnosis not present

## 2017-05-23 DIAGNOSIS — R918 Other nonspecific abnormal finding of lung field: Secondary | ICD-10-CM | POA: Diagnosis not present

## 2017-05-25 DIAGNOSIS — R569 Unspecified convulsions: Secondary | ICD-10-CM | POA: Diagnosis not present

## 2017-05-25 DIAGNOSIS — G309 Alzheimer's disease, unspecified: Secondary | ICD-10-CM | POA: Diagnosis not present

## 2017-05-25 DIAGNOSIS — G9340 Encephalopathy, unspecified: Secondary | ICD-10-CM | POA: Diagnosis not present

## 2017-05-25 DIAGNOSIS — R531 Weakness: Secondary | ICD-10-CM | POA: Diagnosis not present

## 2017-05-25 DIAGNOSIS — I495 Sick sinus syndrome: Secondary | ICD-10-CM | POA: Diagnosis not present

## 2017-05-25 DIAGNOSIS — E039 Hypothyroidism, unspecified: Secondary | ICD-10-CM | POA: Diagnosis not present

## 2017-06-03 DIAGNOSIS — R32 Unspecified urinary incontinence: Secondary | ICD-10-CM | POA: Diagnosis not present

## 2017-06-16 DIAGNOSIS — H10423 Simple chronic conjunctivitis, bilateral: Secondary | ICD-10-CM | POA: Diagnosis not present

## 2017-06-17 DIAGNOSIS — I252 Old myocardial infarction: Secondary | ICD-10-CM | POA: Diagnosis not present

## 2017-06-17 DIAGNOSIS — M5033 Other cervical disc degeneration, cervicothoracic region: Secondary | ICD-10-CM | POA: Diagnosis not present

## 2017-06-17 DIAGNOSIS — M47892 Other spondylosis, cervical region: Secondary | ICD-10-CM | POA: Diagnosis not present

## 2017-06-17 DIAGNOSIS — R569 Unspecified convulsions: Secondary | ICD-10-CM | POA: Diagnosis not present

## 2017-06-17 DIAGNOSIS — I129 Hypertensive chronic kidney disease with stage 1 through stage 4 chronic kidney disease, or unspecified chronic kidney disease: Secondary | ICD-10-CM | POA: Diagnosis not present

## 2017-06-17 DIAGNOSIS — S79912A Unspecified injury of left hip, initial encounter: Secondary | ICD-10-CM | POA: Diagnosis not present

## 2017-06-17 DIAGNOSIS — G301 Alzheimer's disease with late onset: Secondary | ICD-10-CM | POA: Diagnosis not present

## 2017-06-17 DIAGNOSIS — M79605 Pain in left leg: Secondary | ICD-10-CM | POA: Diagnosis not present

## 2017-06-17 DIAGNOSIS — M4312 Spondylolisthesis, cervical region: Secondary | ICD-10-CM | POA: Diagnosis not present

## 2017-06-17 DIAGNOSIS — S0990XA Unspecified injury of head, initial encounter: Secondary | ICD-10-CM | POA: Diagnosis not present

## 2017-06-17 DIAGNOSIS — N189 Chronic kidney disease, unspecified: Secondary | ICD-10-CM | POA: Diagnosis not present

## 2017-06-17 DIAGNOSIS — W19XXXA Unspecified fall, initial encounter: Secondary | ICD-10-CM | POA: Diagnosis not present

## 2017-06-17 DIAGNOSIS — I495 Sick sinus syndrome: Secondary | ICD-10-CM | POA: Diagnosis not present

## 2017-06-17 DIAGNOSIS — R930 Abnormal findings on diagnostic imaging of skull and head, not elsewhere classified: Secondary | ICD-10-CM | POA: Diagnosis not present

## 2017-06-17 DIAGNOSIS — M25552 Pain in left hip: Secondary | ICD-10-CM | POA: Diagnosis not present

## 2017-06-17 DIAGNOSIS — R69 Illness, unspecified: Secondary | ICD-10-CM | POA: Diagnosis not present

## 2017-06-17 DIAGNOSIS — Z043 Encounter for examination and observation following other accident: Secondary | ICD-10-CM | POA: Diagnosis not present

## 2017-06-17 DIAGNOSIS — I10 Essential (primary) hypertension: Secondary | ICD-10-CM | POA: Diagnosis not present

## 2017-06-17 DIAGNOSIS — M160 Bilateral primary osteoarthritis of hip: Secondary | ICD-10-CM | POA: Diagnosis not present

## 2017-06-17 DIAGNOSIS — R269 Unspecified abnormalities of gait and mobility: Secondary | ICD-10-CM | POA: Diagnosis not present

## 2017-06-17 DIAGNOSIS — R51 Headache: Secondary | ICD-10-CM | POA: Diagnosis not present

## 2017-06-18 DIAGNOSIS — R69 Illness, unspecified: Secondary | ICD-10-CM | POA: Diagnosis not present

## 2017-06-18 DIAGNOSIS — G309 Alzheimer's disease, unspecified: Secondary | ICD-10-CM | POA: Diagnosis not present

## 2017-06-18 DIAGNOSIS — G4089 Other seizures: Secondary | ICD-10-CM | POA: Diagnosis not present

## 2017-06-22 DIAGNOSIS — R296 Repeated falls: Secondary | ICD-10-CM | POA: Diagnosis not present

## 2017-06-22 DIAGNOSIS — E785 Hyperlipidemia, unspecified: Secondary | ICD-10-CM | POA: Diagnosis not present

## 2017-06-22 DIAGNOSIS — H409 Unspecified glaucoma: Secondary | ICD-10-CM | POA: Diagnosis not present

## 2017-06-24 DIAGNOSIS — R9082 White matter disease, unspecified: Secondary | ICD-10-CM | POA: Diagnosis not present

## 2017-06-24 DIAGNOSIS — R4182 Altered mental status, unspecified: Secondary | ICD-10-CM | POA: Diagnosis not present

## 2017-06-24 DIAGNOSIS — Z950 Presence of cardiac pacemaker: Secondary | ICD-10-CM | POA: Diagnosis not present

## 2017-06-24 DIAGNOSIS — E785 Hyperlipidemia, unspecified: Secondary | ICD-10-CM | POA: Diagnosis not present

## 2017-06-24 DIAGNOSIS — N189 Chronic kidney disease, unspecified: Secondary | ICD-10-CM | POA: Diagnosis not present

## 2017-06-24 DIAGNOSIS — G309 Alzheimer's disease, unspecified: Secondary | ICD-10-CM | POA: Diagnosis not present

## 2017-06-24 DIAGNOSIS — I638 Other cerebral infarction: Secondary | ICD-10-CM | POA: Diagnosis not present

## 2017-06-24 DIAGNOSIS — R402441 Other coma, without documented Glasgow coma scale score, or with partial score reported, in the field [EMT or ambulance]: Secondary | ICD-10-CM | POA: Diagnosis not present

## 2017-06-24 DIAGNOSIS — R69 Illness, unspecified: Secondary | ICD-10-CM | POA: Diagnosis not present

## 2017-06-24 DIAGNOSIS — Z79899 Other long term (current) drug therapy: Secondary | ICD-10-CM | POA: Diagnosis not present

## 2017-06-24 DIAGNOSIS — R918 Other nonspecific abnormal finding of lung field: Secondary | ICD-10-CM | POA: Diagnosis not present

## 2017-06-24 DIAGNOSIS — Z7409 Other reduced mobility: Secondary | ICD-10-CM | POA: Diagnosis not present

## 2017-06-24 DIAGNOSIS — I6782 Cerebral ischemia: Secondary | ICD-10-CM | POA: Diagnosis not present

## 2017-06-24 DIAGNOSIS — I129 Hypertensive chronic kidney disease with stage 1 through stage 4 chronic kidney disease, or unspecified chronic kidney disease: Secondary | ICD-10-CM | POA: Diagnosis not present

## 2017-06-29 DIAGNOSIS — G309 Alzheimer's disease, unspecified: Secondary | ICD-10-CM | POA: Diagnosis not present

## 2017-07-02 DIAGNOSIS — M2010 Hallux valgus (acquired), unspecified foot: Secondary | ICD-10-CM | POA: Diagnosis not present

## 2017-07-02 DIAGNOSIS — Q845 Enlarged and hypertrophic nails: Secondary | ICD-10-CM | POA: Diagnosis not present

## 2017-07-02 DIAGNOSIS — L853 Xerosis cutis: Secondary | ICD-10-CM | POA: Diagnosis not present

## 2017-07-02 DIAGNOSIS — L603 Nail dystrophy: Secondary | ICD-10-CM | POA: Diagnosis not present

## 2017-07-02 DIAGNOSIS — B351 Tinea unguium: Secondary | ICD-10-CM | POA: Diagnosis not present

## 2017-07-05 DIAGNOSIS — R32 Unspecified urinary incontinence: Secondary | ICD-10-CM | POA: Diagnosis not present

## 2017-07-12 DIAGNOSIS — G319 Degenerative disease of nervous system, unspecified: Secondary | ICD-10-CM | POA: Diagnosis not present

## 2017-07-12 DIAGNOSIS — N189 Chronic kidney disease, unspecified: Secondary | ICD-10-CM | POA: Diagnosis not present

## 2017-07-12 DIAGNOSIS — G40901 Epilepsy, unspecified, not intractable, with status epilepticus: Secondary | ICD-10-CM | POA: Diagnosis not present

## 2017-07-12 DIAGNOSIS — R4182 Altered mental status, unspecified: Secondary | ICD-10-CM | POA: Diagnosis not present

## 2017-07-12 DIAGNOSIS — R401 Stupor: Secondary | ICD-10-CM | POA: Diagnosis not present

## 2017-07-12 DIAGNOSIS — I129 Hypertensive chronic kidney disease with stage 1 through stage 4 chronic kidney disease, or unspecified chronic kidney disease: Secondary | ICD-10-CM | POA: Diagnosis not present

## 2017-07-12 DIAGNOSIS — I10 Essential (primary) hypertension: Secondary | ICD-10-CM | POA: Diagnosis not present

## 2017-07-12 DIAGNOSIS — R69 Illness, unspecified: Secondary | ICD-10-CM | POA: Diagnosis not present

## 2017-07-12 DIAGNOSIS — I6782 Cerebral ischemia: Secondary | ICD-10-CM | POA: Diagnosis not present

## 2017-07-12 DIAGNOSIS — Z7409 Other reduced mobility: Secondary | ICD-10-CM | POA: Diagnosis not present

## 2017-07-12 DIAGNOSIS — R410 Disorientation, unspecified: Secondary | ICD-10-CM | POA: Diagnosis not present

## 2017-07-12 DIAGNOSIS — R9431 Abnormal electrocardiogram [ECG] [EKG]: Secondary | ICD-10-CM | POA: Diagnosis not present

## 2017-07-12 DIAGNOSIS — Z950 Presence of cardiac pacemaker: Secondary | ICD-10-CM | POA: Diagnosis not present

## 2017-07-12 DIAGNOSIS — G309 Alzheimer's disease, unspecified: Secondary | ICD-10-CM | POA: Diagnosis not present

## 2017-07-12 DIAGNOSIS — G40909 Epilepsy, unspecified, not intractable, without status epilepticus: Secondary | ICD-10-CM | POA: Diagnosis not present

## 2017-07-12 DIAGNOSIS — Z79899 Other long term (current) drug therapy: Secondary | ICD-10-CM | POA: Diagnosis not present

## 2017-07-12 DIAGNOSIS — R569 Unspecified convulsions: Secondary | ICD-10-CM | POA: Diagnosis not present

## 2017-07-16 DIAGNOSIS — G4700 Insomnia, unspecified: Secondary | ICD-10-CM | POA: Diagnosis not present

## 2017-07-16 DIAGNOSIS — F329 Major depressive disorder, single episode, unspecified: Secondary | ICD-10-CM | POA: Diagnosis not present

## 2017-07-16 DIAGNOSIS — G309 Alzheimer's disease, unspecified: Secondary | ICD-10-CM | POA: Diagnosis not present

## 2017-07-16 DIAGNOSIS — F0281 Dementia in other diseases classified elsewhere with behavioral disturbance: Secondary | ICD-10-CM | POA: Diagnosis not present

## 2017-07-20 DIAGNOSIS — F329 Major depressive disorder, single episode, unspecified: Secondary | ICD-10-CM | POA: Diagnosis not present

## 2017-07-20 DIAGNOSIS — R569 Unspecified convulsions: Secondary | ICD-10-CM | POA: Diagnosis not present

## 2017-07-20 DIAGNOSIS — I10 Essential (primary) hypertension: Secondary | ICD-10-CM | POA: Diagnosis not present

## 2017-07-20 DIAGNOSIS — G309 Alzheimer's disease, unspecified: Secondary | ICD-10-CM | POA: Diagnosis not present

## 2017-07-26 DIAGNOSIS — H409 Unspecified glaucoma: Secondary | ICD-10-CM | POA: Diagnosis not present

## 2017-07-26 DIAGNOSIS — E785 Hyperlipidemia, unspecified: Secondary | ICD-10-CM | POA: Diagnosis not present

## 2017-07-26 DIAGNOSIS — G9340 Encephalopathy, unspecified: Secondary | ICD-10-CM | POA: Diagnosis not present

## 2017-07-26 DIAGNOSIS — M545 Low back pain: Secondary | ICD-10-CM | POA: Diagnosis not present

## 2017-07-26 DIAGNOSIS — R569 Unspecified convulsions: Secondary | ICD-10-CM | POA: Diagnosis not present

## 2017-07-26 DIAGNOSIS — I10 Essential (primary) hypertension: Secondary | ICD-10-CM | POA: Diagnosis not present

## 2017-07-26 DIAGNOSIS — E039 Hypothyroidism, unspecified: Secondary | ICD-10-CM | POA: Diagnosis not present

## 2017-07-26 DIAGNOSIS — Z950 Presence of cardiac pacemaker: Secondary | ICD-10-CM | POA: Diagnosis not present

## 2017-07-26 DIAGNOSIS — R296 Repeated falls: Secondary | ICD-10-CM | POA: Diagnosis not present

## 2017-07-26 DIAGNOSIS — R531 Weakness: Secondary | ICD-10-CM | POA: Diagnosis not present

## 2017-08-02 DIAGNOSIS — R32 Unspecified urinary incontinence: Secondary | ICD-10-CM | POA: Diagnosis not present

## 2017-08-06 DIAGNOSIS — G4700 Insomnia, unspecified: Secondary | ICD-10-CM | POA: Diagnosis not present

## 2017-08-06 DIAGNOSIS — F0281 Dementia in other diseases classified elsewhere with behavioral disturbance: Secondary | ICD-10-CM | POA: Diagnosis not present

## 2017-08-06 DIAGNOSIS — F329 Major depressive disorder, single episode, unspecified: Secondary | ICD-10-CM | POA: Diagnosis not present

## 2017-08-06 DIAGNOSIS — G309 Alzheimer's disease, unspecified: Secondary | ICD-10-CM | POA: Diagnosis not present

## 2017-08-09 DIAGNOSIS — I10 Essential (primary) hypertension: Secondary | ICD-10-CM | POA: Diagnosis not present

## 2017-08-09 DIAGNOSIS — Z7409 Other reduced mobility: Secondary | ICD-10-CM | POA: Diagnosis not present

## 2017-08-09 DIAGNOSIS — Z79899 Other long term (current) drug therapy: Secondary | ICD-10-CM | POA: Diagnosis not present

## 2017-08-09 DIAGNOSIS — R569 Unspecified convulsions: Secondary | ICD-10-CM | POA: Diagnosis not present

## 2017-08-09 DIAGNOSIS — E785 Hyperlipidemia, unspecified: Secondary | ICD-10-CM | POA: Diagnosis not present

## 2017-08-09 DIAGNOSIS — G40909 Epilepsy, unspecified, not intractable, without status epilepticus: Secondary | ICD-10-CM | POA: Diagnosis not present

## 2017-08-09 DIAGNOSIS — G309 Alzheimer's disease, unspecified: Secondary | ICD-10-CM | POA: Diagnosis not present

## 2017-08-09 DIAGNOSIS — R918 Other nonspecific abnormal finding of lung field: Secondary | ICD-10-CM | POA: Diagnosis not present

## 2017-08-09 DIAGNOSIS — Z950 Presence of cardiac pacemaker: Secondary | ICD-10-CM | POA: Diagnosis not present

## 2017-08-09 DIAGNOSIS — R4182 Altered mental status, unspecified: Secondary | ICD-10-CM | POA: Diagnosis not present

## 2017-08-09 DIAGNOSIS — R402441 Other coma, without documented Glasgow coma scale score, or with partial score reported, in the field [EMT or ambulance]: Secondary | ICD-10-CM | POA: Diagnosis not present

## 2017-08-09 DIAGNOSIS — R69 Illness, unspecified: Secondary | ICD-10-CM | POA: Diagnosis not present

## 2017-08-09 DIAGNOSIS — D696 Thrombocytopenia, unspecified: Secondary | ICD-10-CM | POA: Diagnosis not present

## 2017-08-09 DIAGNOSIS — R9082 White matter disease, unspecified: Secondary | ICD-10-CM | POA: Diagnosis not present

## 2017-08-24 DIAGNOSIS — E039 Hypothyroidism, unspecified: Secondary | ICD-10-CM | POA: Diagnosis not present

## 2017-08-24 DIAGNOSIS — M545 Low back pain: Secondary | ICD-10-CM | POA: Diagnosis not present

## 2017-08-24 DIAGNOSIS — I10 Essential (primary) hypertension: Secondary | ICD-10-CM | POA: Diagnosis not present

## 2017-08-24 DIAGNOSIS — R569 Unspecified convulsions: Secondary | ICD-10-CM | POA: Diagnosis not present

## 2017-08-28 DIAGNOSIS — I10 Essential (primary) hypertension: Secondary | ICD-10-CM | POA: Diagnosis not present

## 2017-08-28 DIAGNOSIS — G40909 Epilepsy, unspecified, not intractable, without status epilepticus: Secondary | ICD-10-CM | POA: Diagnosis not present

## 2017-08-28 DIAGNOSIS — G9341 Metabolic encephalopathy: Secondary | ICD-10-CM | POA: Diagnosis not present

## 2017-08-28 DIAGNOSIS — R791 Abnormal coagulation profile: Secondary | ICD-10-CM | POA: Diagnosis not present

## 2017-08-28 DIAGNOSIS — R4182 Altered mental status, unspecified: Secondary | ICD-10-CM | POA: Diagnosis not present

## 2017-08-28 DIAGNOSIS — R402431 Glasgow coma scale score 3-8, in the field [EMT or ambulance]: Secondary | ICD-10-CM | POA: Diagnosis not present

## 2017-08-28 DIAGNOSIS — R404 Transient alteration of awareness: Secondary | ICD-10-CM | POA: Diagnosis not present

## 2017-08-28 DIAGNOSIS — H409 Unspecified glaucoma: Secondary | ICD-10-CM | POA: Diagnosis not present

## 2017-08-28 DIAGNOSIS — Z950 Presence of cardiac pacemaker: Secondary | ICD-10-CM | POA: Diagnosis not present

## 2017-08-28 DIAGNOSIS — J189 Pneumonia, unspecified organism: Secondary | ICD-10-CM | POA: Diagnosis not present

## 2017-08-28 DIAGNOSIS — W1830XA Fall on same level, unspecified, initial encounter: Secondary | ICD-10-CM | POA: Diagnosis not present

## 2017-08-28 DIAGNOSIS — G309 Alzheimer's disease, unspecified: Secondary | ICD-10-CM | POA: Diagnosis not present

## 2017-08-28 DIAGNOSIS — E039 Hypothyroidism, unspecified: Secondary | ICD-10-CM | POA: Diagnosis not present

## 2017-08-28 DIAGNOSIS — F0280 Dementia in other diseases classified elsewhere without behavioral disturbance: Secondary | ICD-10-CM | POA: Diagnosis not present

## 2017-08-28 DIAGNOSIS — G9340 Encephalopathy, unspecified: Secondary | ICD-10-CM | POA: Diagnosis not present

## 2017-08-28 DIAGNOSIS — N189 Chronic kidney disease, unspecified: Secondary | ICD-10-CM | POA: Diagnosis not present

## 2017-08-28 DIAGNOSIS — R918 Other nonspecific abnormal finding of lung field: Secondary | ICD-10-CM | POA: Diagnosis not present

## 2017-08-28 DIAGNOSIS — F329 Major depressive disorder, single episode, unspecified: Secondary | ICD-10-CM | POA: Diagnosis not present

## 2017-08-28 DIAGNOSIS — R7989 Other specified abnormal findings of blood chemistry: Secondary | ICD-10-CM | POA: Diagnosis not present

## 2017-08-28 DIAGNOSIS — R69 Illness, unspecified: Secondary | ICD-10-CM | POA: Diagnosis not present

## 2017-08-28 DIAGNOSIS — I639 Cerebral infarction, unspecified: Secondary | ICD-10-CM | POA: Diagnosis not present

## 2017-08-29 DIAGNOSIS — E039 Hypothyroidism, unspecified: Secondary | ICD-10-CM | POA: Diagnosis not present

## 2017-08-29 DIAGNOSIS — R69 Illness, unspecified: Secondary | ICD-10-CM | POA: Diagnosis not present

## 2017-08-29 DIAGNOSIS — I10 Essential (primary) hypertension: Secondary | ICD-10-CM | POA: Diagnosis not present

## 2017-08-29 DIAGNOSIS — G9340 Encephalopathy, unspecified: Secondary | ICD-10-CM | POA: Diagnosis not present

## 2017-08-29 DIAGNOSIS — G40909 Epilepsy, unspecified, not intractable, without status epilepticus: Secondary | ICD-10-CM | POA: Diagnosis not present

## 2017-08-29 DIAGNOSIS — I495 Sick sinus syndrome: Secondary | ICD-10-CM | POA: Diagnosis not present

## 2017-08-29 DIAGNOSIS — H409 Unspecified glaucoma: Secondary | ICD-10-CM | POA: Diagnosis not present

## 2017-08-29 DIAGNOSIS — R7989 Other specified abnormal findings of blood chemistry: Secondary | ICD-10-CM | POA: Diagnosis not present

## 2017-08-29 DIAGNOSIS — N183 Chronic kidney disease, stage 3 (moderate): Secondary | ICD-10-CM | POA: Diagnosis not present

## 2017-08-30 DIAGNOSIS — G9340 Encephalopathy, unspecified: Secondary | ICD-10-CM | POA: Diagnosis not present

## 2017-08-30 DIAGNOSIS — Z7409 Other reduced mobility: Secondary | ICD-10-CM | POA: Diagnosis not present

## 2017-08-30 DIAGNOSIS — I495 Sick sinus syndrome: Secondary | ICD-10-CM | POA: Diagnosis not present

## 2017-08-30 DIAGNOSIS — E039 Hypothyroidism, unspecified: Secondary | ICD-10-CM | POA: Diagnosis not present

## 2017-08-30 DIAGNOSIS — N183 Chronic kidney disease, stage 3 (moderate): Secondary | ICD-10-CM | POA: Diagnosis not present

## 2017-08-30 DIAGNOSIS — H409 Unspecified glaucoma: Secondary | ICD-10-CM | POA: Diagnosis not present

## 2017-08-30 DIAGNOSIS — R7989 Other specified abnormal findings of blood chemistry: Secondary | ICD-10-CM | POA: Diagnosis not present

## 2017-08-30 DIAGNOSIS — R4182 Altered mental status, unspecified: Secondary | ICD-10-CM | POA: Diagnosis not present

## 2017-08-30 DIAGNOSIS — I10 Essential (primary) hypertension: Secondary | ICD-10-CM | POA: Diagnosis not present

## 2017-08-30 DIAGNOSIS — G40909 Epilepsy, unspecified, not intractable, without status epilepticus: Secondary | ICD-10-CM | POA: Diagnosis not present

## 2017-08-30 DIAGNOSIS — R69 Illness, unspecified: Secondary | ICD-10-CM | POA: Diagnosis not present

## 2017-08-31 DIAGNOSIS — G9340 Encephalopathy, unspecified: Secondary | ICD-10-CM | POA: Diagnosis not present

## 2017-08-31 DIAGNOSIS — J189 Pneumonia, unspecified organism: Secondary | ICD-10-CM | POA: Diagnosis not present

## 2017-08-31 DIAGNOSIS — I10 Essential (primary) hypertension: Secondary | ICD-10-CM | POA: Diagnosis not present

## 2017-09-01 DIAGNOSIS — R32 Unspecified urinary incontinence: Secondary | ICD-10-CM | POA: Diagnosis not present

## 2017-09-10 DIAGNOSIS — G309 Alzheimer's disease, unspecified: Secondary | ICD-10-CM | POA: Diagnosis not present

## 2017-09-10 DIAGNOSIS — F0281 Dementia in other diseases classified elsewhere with behavioral disturbance: Secondary | ICD-10-CM | POA: Diagnosis not present

## 2017-09-10 DIAGNOSIS — F329 Major depressive disorder, single episode, unspecified: Secondary | ICD-10-CM | POA: Diagnosis not present

## 2017-09-10 DIAGNOSIS — G4700 Insomnia, unspecified: Secondary | ICD-10-CM | POA: Diagnosis not present

## 2017-09-21 DIAGNOSIS — R569 Unspecified convulsions: Secondary | ICD-10-CM | POA: Diagnosis not present

## 2017-09-21 DIAGNOSIS — I10 Essential (primary) hypertension: Secondary | ICD-10-CM | POA: Diagnosis not present

## 2017-09-21 DIAGNOSIS — E039 Hypothyroidism, unspecified: Secondary | ICD-10-CM | POA: Diagnosis not present

## 2017-09-21 DIAGNOSIS — M545 Low back pain: Secondary | ICD-10-CM | POA: Diagnosis not present

## 2017-10-01 DIAGNOSIS — M2010 Hallux valgus (acquired), unspecified foot: Secondary | ICD-10-CM | POA: Diagnosis not present

## 2017-10-01 DIAGNOSIS — L603 Nail dystrophy: Secondary | ICD-10-CM | POA: Diagnosis not present

## 2017-10-01 DIAGNOSIS — Q845 Enlarged and hypertrophic nails: Secondary | ICD-10-CM | POA: Diagnosis not present

## 2017-10-01 DIAGNOSIS — B351 Tinea unguium: Secondary | ICD-10-CM | POA: Diagnosis not present

## 2017-10-01 DIAGNOSIS — L853 Xerosis cutis: Secondary | ICD-10-CM | POA: Diagnosis not present

## 2017-10-05 DIAGNOSIS — R32 Unspecified urinary incontinence: Secondary | ICD-10-CM | POA: Diagnosis not present

## 2017-10-09 DIAGNOSIS — R69 Illness, unspecified: Secondary | ICD-10-CM | POA: Diagnosis not present

## 2017-10-09 DIAGNOSIS — R569 Unspecified convulsions: Secondary | ICD-10-CM | POA: Diagnosis not present

## 2017-10-09 DIAGNOSIS — R410 Disorientation, unspecified: Secondary | ICD-10-CM | POA: Diagnosis not present

## 2017-10-09 DIAGNOSIS — R918 Other nonspecific abnormal finding of lung field: Secondary | ICD-10-CM | POA: Diagnosis not present

## 2017-10-09 DIAGNOSIS — I129 Hypertensive chronic kidney disease with stage 1 through stage 4 chronic kidney disease, or unspecified chronic kidney disease: Secondary | ICD-10-CM | POA: Diagnosis not present

## 2017-10-09 DIAGNOSIS — G309 Alzheimer's disease, unspecified: Secondary | ICD-10-CM | POA: Diagnosis not present

## 2017-10-09 DIAGNOSIS — G9389 Other specified disorders of brain: Secondary | ICD-10-CM | POA: Diagnosis not present

## 2017-10-09 DIAGNOSIS — Z79899 Other long term (current) drug therapy: Secondary | ICD-10-CM | POA: Diagnosis not present

## 2017-10-09 DIAGNOSIS — E039 Hypothyroidism, unspecified: Secondary | ICD-10-CM | POA: Diagnosis not present

## 2017-10-09 DIAGNOSIS — Z950 Presence of cardiac pacemaker: Secondary | ICD-10-CM | POA: Diagnosis not present

## 2017-10-09 DIAGNOSIS — G40909 Epilepsy, unspecified, not intractable, without status epilepticus: Secondary | ICD-10-CM | POA: Diagnosis not present

## 2017-10-09 DIAGNOSIS — N189 Chronic kidney disease, unspecified: Secondary | ICD-10-CM | POA: Diagnosis not present

## 2017-10-09 DIAGNOSIS — E785 Hyperlipidemia, unspecified: Secondary | ICD-10-CM | POA: Diagnosis not present

## 2017-10-09 DIAGNOSIS — J984 Other disorders of lung: Secondary | ICD-10-CM | POA: Diagnosis not present

## 2017-10-09 DIAGNOSIS — G40901 Epilepsy, unspecified, not intractable, with status epilepticus: Secondary | ICD-10-CM | POA: Diagnosis not present

## 2017-10-09 DIAGNOSIS — R4182 Altered mental status, unspecified: Secondary | ICD-10-CM | POA: Diagnosis not present

## 2017-10-12 DIAGNOSIS — R569 Unspecified convulsions: Secondary | ICD-10-CM | POA: Diagnosis not present

## 2017-10-15 DIAGNOSIS — F0281 Dementia in other diseases classified elsewhere with behavioral disturbance: Secondary | ICD-10-CM | POA: Diagnosis not present

## 2017-10-15 DIAGNOSIS — G4700 Insomnia, unspecified: Secondary | ICD-10-CM | POA: Diagnosis not present

## 2017-10-15 DIAGNOSIS — F329 Major depressive disorder, single episode, unspecified: Secondary | ICD-10-CM | POA: Diagnosis not present

## 2017-10-15 DIAGNOSIS — G309 Alzheimer's disease, unspecified: Secondary | ICD-10-CM | POA: Diagnosis not present

## 2017-10-19 DIAGNOSIS — R569 Unspecified convulsions: Secondary | ICD-10-CM | POA: Diagnosis not present

## 2017-10-19 DIAGNOSIS — Z950 Presence of cardiac pacemaker: Secondary | ICD-10-CM | POA: Diagnosis not present

## 2017-10-19 DIAGNOSIS — E039 Hypothyroidism, unspecified: Secondary | ICD-10-CM | POA: Diagnosis not present

## 2017-10-19 DIAGNOSIS — M545 Low back pain: Secondary | ICD-10-CM | POA: Diagnosis not present

## 2017-10-19 DIAGNOSIS — I10 Essential (primary) hypertension: Secondary | ICD-10-CM | POA: Diagnosis not present

## 2017-11-03 DIAGNOSIS — R32 Unspecified urinary incontinence: Secondary | ICD-10-CM | POA: Diagnosis not present

## 2018-05-06 ENCOUNTER — Non-Acute Institutional Stay: Payer: Medicare HMO | Admitting: Internal Medicine

## 2018-05-06 DIAGNOSIS — R413 Other amnesia: Secondary | ICD-10-CM

## 2018-05-06 DIAGNOSIS — R569 Unspecified convulsions: Secondary | ICD-10-CM

## 2018-06-02 ENCOUNTER — Telehealth: Payer: Self-pay

## 2018-06-02 ENCOUNTER — Non-Acute Institutional Stay: Payer: Medicare HMO | Admitting: Internal Medicine

## 2018-06-02 NOTE — Telephone Encounter (Signed)
Phone call [facility] to report patient had a seizure yesterday and was evaluated in the ED. No current needs reported.

## 2018-06-03 DIAGNOSIS — R413 Other amnesia: Secondary | ICD-10-CM | POA: Insufficient documentation

## 2018-06-03 NOTE — Progress Notes (Signed)
PALLIATIVE CARE CONSULT VISIT   PATIENT NAME: Wesley Cook DOB: 1934/11/07 MRN: [id number]  PRIMARY CARE PROVIDER:   Ron Parker, MD  REFERRING PROVIDER:  Marlowe Aschoff, NP RESPONSIBLE PARTY:   Shoichi Mielke)  [phone]        RECOMMENDATIONS and PLAN:  1.  Memory Loss  R41.3:  FAST 6d.  No reports of behavioral disturbance.  Stable on Zoloft and Remeron.  Good appetite and weight is stable.  Continue activities with supervision and support at memory care facility.  Monitor  2.  Seizures  R56.9:  Not well managed on Keppra 750mg  BID.Marland Kitchen  Recurrent visits to the ER.  Consider evaluation of Keppra level. Increase dose if subtherapeutic.    3. Palliative care patient:  FULL CODE.  Pt is unable to make decisions for himself due to cognitive deficits.  Plan on communication with wife to discuss goals of care and advanced care planning. Joint plans discussed to decrease ER visits related to recurrent seizures.   I spent 45 minutes providing this consultation,  from 09:30AM to 10:15AM in memer. More than 50% of the time in this consultation was spent coordinating communication with clinical staff and Marlowe Aschoff, NP via phone.     HISTORY OF PRESENT ILLNESS:  Wesley Cook is a 84 y.o. year old male with multiple medical problems including dementia, CKD stage III, seizure disorder and sinus node dysfunction with pacemaker placement.  Staff reports that patient is totally dependent upon staff for performance of all ADLs, he is able to feed self and normally eats 100% of meals, ambulate without assistance, he is incontinent of of B&B. He has been to the local ER several times due to recurrent seizures.   Palliative Care was asked to help address goals of care and advanced care planning.   CODE STATUS:  FULL CODE   PPS: 50% HOSPICE ELIGIBILITY/DIAGNOSIS: TBD  PAST MEDICAL HISTORY:  Past Medical History:  Diagnosis Date  . Alzheimer's dementia   . CKD (chronic kidney disease),  stage III   . Headache   . Hypertension   . Pacemaker   . Sinus node dysfunction (HCC)    a. s/p MDT dual chamber pacemaker implanted by Dr Amil Amen  . Thrombocytopenia (HCC)     SOCIAL HX:  Social History   Tobacco Use  . Smoking status: Never Smoker  . Smokeless tobacco: Never Used  Substance Use Topics  . Alcohol use: No    ALLERGIES: No Known Allergies   PERTINENT MEDICATIONS:  Outpatient Encounter Medications as of 05/06/2018  Medication Sig  . acetaminophen (TYLENOL) 500 MG tablet Take 500 mg by mouth every 4 (four) hours as needed for mild pain, moderate pain or fever.   Marland Kitchen alum & mag hydroxide-simeth (MAALOX/MYLANTA) 200-200-20 MG/5ML suspension Take 30 mLs by mouth every 6 (six) hours as needed for indigestion or heartburn.  Marland Kitchen amLODipine (NORVASC) 10 MG tablet Take 1 tablet (10 mg total) by mouth daily.  Marland Kitchen aspirin EC 81 MG tablet Take 1 tablet (81 mg total) by mouth daily.  . citalopram (CELEXA) 20 MG tablet Take 1 tablet (20 mg total) by mouth daily.  Marland Kitchen donepezil (ARICEPT) 10 MG tablet Take 1 tablet (10 mg total) by mouth daily.  Marland Kitchen levETIRAcetam (KEPPRA) 500 MG tablet Take 1 tablet (500 mg total) by mouth 2 (two) times daily.  Marland Kitchen loperamide (IMODIUM) 2 MG capsule Take 2 mg by mouth daily as needed for diarrhea or loose stools.   Marland Kitchen LORazepam (ATIVAN) 0.5  MG tablet Take 1 tablet (0.5 mg total) by mouth daily as needed for anxiety (agitation).  . magnesium hydroxide (MILK OF MAGNESIA) 400 MG/5ML suspension Take 30 mLs by mouth at bedtime as needed for mild constipation.  . pravastatin (PRAVACHOL) 20 MG tablet Take 1 tablet (20 mg total) by mouth daily.  . timolol (TIMOPTIC) 0.5 % ophthalmic solution Place 1 drop into both eyes daily.  . Travoprost, BAK Free, (TRAVATAN) 0.004 % SOLN ophthalmic solution Place 1 drop into both eyes at bedtime.  . traZODone (DESYREL) 50 MG tablet Take 1 tablet (50 mg total) by mouth at bedtime as needed for sleep (agitation).   No  facility-administered encounter medications on file as of 05/06/2018.     PHYSICAL EXAM:   General:  Frail and elderly male reclining in bed. In NAD                                                          Cardiovascular: regular rate and rhythm. Pacemaker noted Pulmonary: clear ant fields Abdomen: soft, nontender, + bowel sounds Extremities: no edema, no joint deformities Skin: no rashes Neurological: Somnolent but arouses easily.  Oriented to self only.  Follows commands and speaks in 3-4 words.  Later seen ambulating steadily.  Wesley SheffieldShirley Zeddie Njie, NP-C

## 2018-06-03 NOTE — Progress Notes (Signed)
PALLIATIVE CARE CONSULT VISIT   PATIENT NAME: Wesley Cook DOB: [birth year] MRN: [id number]  PRIMARY CARE PROVIDER:   Ron ParkerBowen, Samuel, MD  REFERRING PROVIDER: Marlowe AschoffLatrice Polite, NP RESPONSIBLE PARTY:  Hollie BeachBarbara Haught(wife)  [phone]     RECOMMENDATIONS and PLAN:  1. Memory Loss FAST stage  R41.3 :  FAST stage 6d with significant cueing.  No reported behaviors.  Stable without use of meds for memory.  Continue daily supportive care and activities with guidance.  2.  Seizures R56.9:  Recurrent despite normal serum levels of anticonvulsant.  Not well managed with use of Keppra 750mg  BID.  Appt with Neurologist noted for 05/31/18 in system but pt did not go to appt.  FU with Neuro ASAP for med adjustment.  3.  Palliative care encounter:  FULL CODE.  Family not at bedside.  Primary concern is to minimize recurrent seizure activity.  Encouraged staff/Annette to reschedule missed Neuro appt. Previous discussion with wife who desires full scope of treatment for patient and to return to the hospital as needed.  Palliative care will continue to follow.   I spent 30 minutes providing this consultation,  from 2:00pm to 2:30pm at memory care. More than 50% of the time in this consultation was spent coordinating communication with clinical staff.   HISTORY OF PRESENT ILLNESS: Follow-up with Wesley Cook who continues to have episodic seizures per staff report.  He has been evaluated for some at ED as well as remaining at facility for brief occurring seizures.  Currently taking Keppra 750mg  BID.   He continues to eat well and require full assistance with ADLs.  CODE STATUS: FULL CODE  PPS: 50% HOSPICE ELIGIBILITY/DIAGNOSIS: TBD  PAST MEDICAL HISTORY:  Past Medical History:  Diagnosis Date  . Alzheimer's dementia   . CKD (chronic kidney disease), stage III   . Headache   . Hypertension   . Pacemaker   . Sinus node dysfunction (HCC)    a. s/p MDT dual chamber pacemaker implanted by Dr Amil AmenEdmunds    . Thrombocytopenia (HCC)     SOCIAL HX:  Social History   Tobacco Use  . Smoking status: Never Smoker  . Smokeless tobacco: Never Used  Substance Use Topics  . Alcohol use: No    ALLERGIES: No Known Allergies   PERTINENT MEDICATIONS:  Outpatient Encounter Medications as of 06/02/2018  Medication Sig  . acetaminophen (TYLENOL) 500 MG tablet Take 500 mg by mouth every 4 (four) hours as needed for mild pain, moderate pain or fever.   Marland Kitchen. alum & mag hydroxide-simeth (MAALOX/MYLANTA) 200-200-20 MG/5ML suspension Take 30 mLs by mouth every 6 (six) hours as needed for indigestion or heartburn.  Marland Kitchen. amLODipine (NORVASC) 10 MG tablet Take 1 tablet (10 mg total) by mouth daily.  Marland Kitchen. aspirin EC 81 MG tablet Take 1 tablet (81 mg total) by mouth daily.  . citalopram (CELEXA) 20 MG tablet Take 1 tablet (20 mg total) by mouth daily.  Marland Kitchen. donepezil (ARICEPT) 10 MG tablet Take 1 tablet (10 mg total) by mouth daily.  Marland Kitchen. levETIRAcetam (KEPPRA) 500 MG tablet Take 1 tablet (500 mg total) by mouth 2 (two) times daily.  Marland Kitchen. loperamide (IMODIUM) 2 MG capsule Take 2 mg by mouth daily as needed for diarrhea or loose stools.   Marland Kitchen. LORazepam (ATIVAN) 0.5 MG tablet Take 1 tablet (0.5 mg total) by mouth daily as needed for anxiety (agitation).  . magnesium hydroxide (MILK OF MAGNESIA) 400 MG/5ML suspension Take 30 mLs by mouth at bedtime as needed  for mild constipation.  . pravastatin (PRAVACHOL) 20 MG tablet Take 1 tablet (20 mg total) by mouth daily.  . timolol (TIMOPTIC) 0.5 % ophthalmic solution Place 1 drop into both eyes daily.  . Travoprost, BAK Free, (TRAVATAN) 0.004 % SOLN ophthalmic solution Place 1 drop into both eyes at bedtime.  . traZODone (DESYREL) 50 MG tablet Take 1 tablet (50 mg total) by mouth at bedtime as needed for sleep (agitation).   No facility-administered encounter medications on file as of 06/02/2018.     PHYSICAL EXAM:   General: Chronically ill and  frail appearing elderly male walking about  in DR Cardiovascular: regular rate and rhythm Pulmonary: clear ant fields Abdomen: soft, nontender, + bowel sounds Extremities: no edema Skin: Exposed skin is intact Neurological: Alert and oriented to self.  Generalized weakness  Margaretha Sheffield, NP-C

## 2018-06-05 ENCOUNTER — Telehealth: Payer: Self-pay | Admitting: Internal Medicine

## 2018-06-05 NOTE — Telephone Encounter (Signed)
Phone call to wife to introduce Palliative care for patient.  Goals and Advanced care planning discussed since wife normally visits on the weekends and is dependent upon family members for transportation [place] to [place].  She states that patient will be a long-term resident of current facility. She would life for [place] in [place].  She desires a full scope of treatment for patient with attempt of CPR and taken to the hospital as needed, IV and antibiotics if indicated.  She also desires feeding tube placement if necessary for him.  She will be updated on patient status as needed.

## 2018-06-30 ENCOUNTER — Non-Acute Institutional Stay: Payer: Medicare HMO | Admitting: Internal Medicine

## 2018-06-30 DIAGNOSIS — R413 Other amnesia: Secondary | ICD-10-CM

## 2018-06-30 DIAGNOSIS — R569 Unspecified convulsions: Secondary | ICD-10-CM

## 2018-06-30 DIAGNOSIS — Z515 Encounter for palliative care: Secondary | ICD-10-CM

## 2018-06-30 NOTE — Progress Notes (Signed)
PALLIATIVE CARE CONSULT VISIT   PATIENT NAME: Wesley Cook DOB: Oct 06, 1934 MRN: [id number]  PRIMARY CARE PROVIDER:   Ron Parker, MD  REFERRING PROVIDER: Marlowe Aschoff, NP RESPONSIBLE PARTY:  Mateus Rewerts)  [phone]     RECOMMENDATIONS and PLAN:  1. Memory Loss   R41.3 :  FAST stage 6d with  cueing. Citalopram increased to 40mg  No behaviors  Continue daily supportive care and activities with guidance.  2.  Seizures R56.9: No reported seizure activity since June 2019.   Managed with use of Keppra 750mg  BID.  Pending appointment with neurologist in September.  Continue to monitor.  3.  Palliative care encounter Z51.5:    FULL CODE and full scope of treatment per previous discussion with wife. She is also in favor of IV fluids and antibiotics if indicated.  MOST form was completed per wife's request and placed in chart.   Family not present during today's visit.  Long-term patient of memory care. .  Palliative care will continue to follow.   I spent 30 minutes providing this consultation,  from 2:00pm to 2:30pm at memory care. More than 50% of the time in this consultation was spent coordinating communication with clinical staff.   HISTORY OF PRESENT ILLNESS: Follow-up with Kathreen Devoid.  Staff reports no recurrent seizure activity since June 2019. He continues to take Keppra 750mg  BID and Citalopram 40mg  qd.   No reports of behaviors.  He continues to eat well and require full assistance with ADLs.  CODE STATUS: FULL CODE  PPS: 50% HOSPICE ELIGIBILITY/DIAGNOSIS: TBD  PAST MEDICAL HISTORY:  Past Medical History:  Diagnosis Date  . Alzheimer's dementia   . CKD (chronic kidney disease), stage III   . Headache   . Hypertension   . Pacemaker   . Sinus node dysfunction (HCC)    a. s/p MDT dual chamber pacemaker implanted by Dr Amil Amen  . Thrombocytopenia (HCC)     SOCIAL HX:  Social History   Tobacco Use  . Smoking status: Never Smoker  . Smokeless tobacco:  Never Used  Substance Use Topics  . Alcohol use: No    ALLERGIES: No Known Allergies   PERTINENT MEDICATIONS:  Outpatient Encounter Medications as of 06/02/2018  Medication Sig  . acetaminophen (TYLENOL) 500 MG tablet Take 500 mg by mouth every 4 (four) hours as needed for mild pain, moderate pain or fever.   Marland Kitchen alum & mag hydroxide-simeth (MAALOX/MYLANTA) 200-200-20 MG/5ML suspension Take 30 mLs by mouth every 6 (six) hours as needed for indigestion or heartburn.  Marland Kitchen amLODipine (NORVASC) 10 MG tablet Take 1 tablet (10 mg total) by mouth daily.  Marland Kitchen aspirin EC 81 MG tablet Take 1 tablet (81 mg total) by mouth daily.  . citalopram (CELEXA) 20 MG tablet Take 1 tablet (20 mg total) by mouth daily.  Marland Kitchen donepezil (ARICEPT) 10 MG tablet Take 1 tablet (10 mg total) by mouth daily.  Marland Kitchen levETIRAcetam (KEPPRA) 500 MG tablet Take 1 tablet (500 mg total) by mouth 2 (two) times daily.  Marland Kitchen loperamide (IMODIUM) 2 MG capsule Take 2 mg by mouth daily as needed for diarrhea or loose stools.   Marland Kitchen LORazepam (ATIVAN) 0.5 MG tablet Take 1 tablet (0.5 mg total) by mouth daily as needed for anxiety (agitation).  . magnesium hydroxide (MILK OF MAGNESIA) 400 MG/5ML suspension Take 30 mLs by mouth at bedtime as needed for mild constipation.  . pravastatin (PRAVACHOL) 20 MG tablet Take 1 tablet (20 mg total) by mouth daily.  Marland Kitchen  timolol (TIMOPTIC) 0.5 % ophthalmic solution Place 1 drop into both eyes daily.  . Travoprost, BAK Free, (TRAVATAN) 0.004 % SOLN ophthalmic solution Place 1 drop into both eyes at bedtime.  . traZODone (DESYREL) 50 MG tablet Take 1 tablet (50 mg total) by mouth at bedtime as needed for sleep (agitation).   No facility-administered encounter medications on file as of 06/02/2018.     PHYSICAL EXAM:   General: Chronically ill and  frail appearing elderly male reclining in bed.  In NAD Cardiovascular: regular rate and rhythm Pulmonary: clear ant fields Abdomen: soft, nontender, + bowel  sounds Extremities: no edema Skin: Exposed skin is intact Neurological: Somnolent but arouses easily.  Follows commands and speaks in 3-4 word sentences.   Generalized weakness  Margaretha SheffieldShirley Ha Placeres, NP-C

## 2018-07-12 ENCOUNTER — Telehealth: Payer: Self-pay

## 2018-07-12 NOTE — Telephone Encounter (Signed)
Received update that patient had seizure activity and was taken to ED via EMS on 07/11/18. Phone call placed to facility to check on patient.Spoke with Meg at facility who reported that patient was not admitted and returned to facility. No new orders and patient is at baseline. Will update Talbert Forest NP

## 2018-08-02 ENCOUNTER — Telehealth: Payer: Self-pay | Admitting: Internal Medicine

## 2018-08-04 ENCOUNTER — Encounter: Payer: Medicare HMO | Admitting: Internal Medicine

## 2018-09-20 ENCOUNTER — Telehealth: Payer: Self-pay | Admitting: Cardiology

## 2018-09-20 NOTE — Telephone Encounter (Signed)
Attempted to call pt to confirm release [facility] carelink [facility] to [facility]. No answer and unable to leave a message.

## 2018-09-27 ENCOUNTER — Non-Acute Institutional Stay: Payer: Medicare HMO | Admitting: Internal Medicine

## 2018-09-27 DIAGNOSIS — Z515 Encounter for palliative care: Secondary | ICD-10-CM

## 2018-10-05 ENCOUNTER — Telehealth: Payer: Self-pay | Admitting: Cardiology

## 2018-10-05 NOTE — Telephone Encounter (Signed)
Spoke w/ [facility] and they informed me that patient is not a resident there. Unable to reach patient at other numbers listed.

## 2019-05-10 DEATH — deceased

## 2019-08-24 NOTE — Progress Notes (Signed)
PALLIATIVE CARE CONSULT VISIT   PATIENT NAME: Wesley Cook DOB: 23-Nov-1933 MRN: [id number]  PRIMARY CARE PROVIDER:   Sande Brothers, MD  REFERRING PROVIDER: Lynnell Catalan, NP RESPONSIBLE PARTY:  Wesley Cook)  [phone]     RECOMMENDATIONS and PLAN:  Palliative Care Encounter Z51.5  1. Memory Loss :  UnchangedFAST stage 6d with  cueing. No behaviors.  Continue Aricept and supervised activites.  2.  Seizures:   Managed with use of Keppra 750mg  BID.    Continue to monitor.  3.  Advance care planning:   Readdressed goals of care with wife who still desires FULL CODE and full scope of treatment. MOST form in chart.  Family not present during today's visit but desires long-term residence of memory care. Potential relocation to [place] facility per wife.  Palliative care will continue to follow as needed.  I spent 30 minutes providing this consultation,  from 2:30pm to 3:00pm at memory care. More than 50% of the time in this consultation was spent coordinating communication with clinical staff and patient.   HISTORY OF PRESENT ILLNESS: Follow-up with Wesley Cook.  Staff reports no recurrent seizure activity.   No reports of behaviors.  He continues to eat well and requires full assistance with ADLs.   CODE STATUS: FULL CODE  PPS: 50% HOSPICE ELIGIBILITY/DIAGNOSIS: TBD  PAST MEDICAL HISTORY:  Past Medical History:  Diagnosis Date  . Alzheimer's dementia   . CKD (chronic kidney disease), stage III   . Headache   . Hypertension   . Pacemaker   . Sinus node dysfunction (H[facility])    a. s/p MDT dual chamber pacemaker implanted by Dr Wesley Cook  . Thrombocytopenia ([place])      PERTINENT MEDICATIONS:  Outpatient Encounter Medications as of 06/02/2018  Medication Sig  . acetaminophen (TYLENOL) 500 MG tablet Take 500 mg by mouth every 4 (four) hours as needed for mild pain, moderate pain or fever.   Marland Kitchen alum & mag hydroxide-simeth (MAALOX/MYLANTA) 200-200-20 MG/5ML suspension  Take 30 mLs by mouth every 6 (six) hours as needed for indigestion or heartburn.  Marland Kitchen amLODipine (NORVASC) 10 MG tablet Take 1 tablet (10 mg total) by mouth daily.  Marland Kitchen aspirin EC 81 MG tablet Take 1 tablet (81 mg total) by mouth daily.  . citalopram (CELEXA) 20 MG tablet Take 1 tablet (20 mg total) by mouth daily.  Marland Kitchen donepezil (ARICEPT) 10 MG tablet Take 1 tablet (10 mg total) by mouth daily.  Marland Kitchen levETIRAcetam (KEPPRA) 500 MG tablet Take 1 tablet (500 mg total) by mouth 2 (two) times daily.  Marland Kitchen loperamide (IMODIUM) 2 MG capsule Take 2 mg by mouth daily as needed for diarrhea or loose stools.   Marland Kitchen LORazepam (ATIVAN) 0.5 MG tablet Take 1 tablet (0.5 mg total) by mouth daily as needed for anxiety (agitation).  . magnesium hydroxide (MILK OF MAGNESIA) 400 MG/5ML suspension Take 30 mLs by mouth at bedtime as needed for mild constipation.  . pravastatin (PRAVACHOL) 20 MG tablet Take 1 tablet (20 mg total) by mouth daily.  . timolol (TIMOPTIC) 0.5 % ophthalmic solution Place 1 drop into both eyes daily.  . Travoprost, BAK Free, (TRAVATAN) 0.004 % SOLN ophthalmic solution Place 1 drop into both eyes at bedtime.  . traZODone (DESYREL) 50 MG tablet Take 1 tablet (50 mg total) by mouth at bedtime as needed for sleep (agitation).    PHYSICAL EXAM:   General: Chronically ill appearing elderly male sitting at DR table.  In NAD Cardiovascular: regular rate and  rhythm Pulmonary: clear throughout Abdomen: soft, nontender, + bowel sounds Extremities: no edema Skin: Exposed skin is intact Neurological: Alert and oriented to self  Follows commands and speaks in 3-4 word sentences. Generalized weakness  Wesley Sheffield, NP-C

## 2019-09-12 NOTE — Telephone Encounter (Signed)
Disregard.  Erroneous note.  Gonzella Lex, NP

## 2019-09-12 NOTE — Addendum Note (Signed)
Addended by: Gonzella Lex on: 09/12/2019 10:32 AM   Modules accepted: Level of Service
